# Patient Record
Sex: Female | Born: 1980 | Race: Black or African American | Hispanic: No | Marital: Married | Smoking: Never smoker
Health system: Southern US, Community
[De-identification: ages and names within clinical notes are randomized; demographics above are authoritative.]

## PROBLEM LIST (undated history)

## (undated) ENCOUNTER — Inpatient Hospital Stay (HOSPITAL_COMMUNITY): Payer: Self-pay

## (undated) DIAGNOSIS — N90810 Female genital mutilation status, unspecified: Secondary | ICD-10-CM

## (undated) DIAGNOSIS — R51 Headache: Secondary | ICD-10-CM

## (undated) DIAGNOSIS — Z843 Family history of consanguinity: Secondary | ICD-10-CM

## (undated) DIAGNOSIS — R768 Other specified abnormal immunological findings in serum: Secondary | ICD-10-CM

## (undated) DIAGNOSIS — O24419 Gestational diabetes mellitus in pregnancy, unspecified control: Secondary | ICD-10-CM

## (undated) HISTORY — DX: Female genital mutilation status, unspecified: N90.810

## (undated) HISTORY — DX: Headache: R51

## (undated) HISTORY — DX: Gestational diabetes mellitus in pregnancy, unspecified control: O24.419

## (undated) HISTORY — PX: CHOLECYSTECTOMY: SHX55

## (undated) HISTORY — DX: Other specified abnormal immunological findings in serum: R76.8

## (undated) HISTORY — PX: TONSILLECTOMY: SUR1361

## (undated) HISTORY — PX: OTHER SURGICAL HISTORY: SHX169

## (undated) HISTORY — DX: Family history of consanguinity: Z84.3

---

## 2004-07-13 ENCOUNTER — Emergency Department (HOSPITAL_COMMUNITY): Admission: EM | Admit: 2004-07-13 | Discharge: 2004-07-13 | Payer: Self-pay | Admitting: Emergency Medicine

## 2005-01-18 ENCOUNTER — Inpatient Hospital Stay (HOSPITAL_COMMUNITY): Admission: AD | Admit: 2005-01-18 | Discharge: 2005-01-18 | Payer: Self-pay | Admitting: Gynecology

## 2005-01-24 ENCOUNTER — Other Ambulatory Visit: Admission: RE | Admit: 2005-01-24 | Discharge: 2005-01-24 | Payer: Self-pay | Admitting: Obstetrics and Gynecology

## 2005-04-01 ENCOUNTER — Ambulatory Visit: Payer: Self-pay | Admitting: Gastroenterology

## 2005-05-21 ENCOUNTER — Ambulatory Visit (HOSPITAL_COMMUNITY): Admission: RE | Admit: 2005-05-21 | Discharge: 2005-05-21 | Payer: Self-pay | Admitting: Gastroenterology

## 2005-05-29 ENCOUNTER — Ambulatory Visit: Payer: Self-pay | Admitting: Gastroenterology

## 2005-06-19 ENCOUNTER — Encounter: Admission: RE | Admit: 2005-06-19 | Discharge: 2005-06-19 | Payer: Self-pay | Admitting: Obstetrics and Gynecology

## 2005-08-05 ENCOUNTER — Inpatient Hospital Stay (HOSPITAL_COMMUNITY): Admission: AD | Admit: 2005-08-05 | Discharge: 2005-08-08 | Payer: Self-pay | Admitting: Obstetrics and Gynecology

## 2005-09-14 ENCOUNTER — Emergency Department (HOSPITAL_COMMUNITY): Admission: EM | Admit: 2005-09-14 | Discharge: 2005-09-14 | Payer: Self-pay | Admitting: Emergency Medicine

## 2005-09-15 ENCOUNTER — Emergency Department (HOSPITAL_COMMUNITY): Admission: EM | Admit: 2005-09-15 | Discharge: 2005-09-15 | Payer: Self-pay | Admitting: *Deleted

## 2005-11-14 ENCOUNTER — Ambulatory Visit (HOSPITAL_COMMUNITY): Admission: RE | Admit: 2005-11-14 | Discharge: 2005-11-14 | Payer: Self-pay | Admitting: Obstetrics and Gynecology

## 2005-11-14 ENCOUNTER — Encounter (INDEPENDENT_AMBULATORY_CARE_PROVIDER_SITE_OTHER): Payer: Self-pay | Admitting: Specialist

## 2005-11-15 ENCOUNTER — Inpatient Hospital Stay (HOSPITAL_COMMUNITY): Admission: AD | Admit: 2005-11-15 | Discharge: 2005-11-16 | Payer: Self-pay | Admitting: Obstetrics and Gynecology

## 2005-11-21 ENCOUNTER — Ambulatory Visit: Payer: Self-pay | Admitting: Gastroenterology

## 2006-01-05 ENCOUNTER — Ambulatory Visit: Payer: Self-pay | Admitting: Endocrinology

## 2006-04-16 ENCOUNTER — Encounter (INDEPENDENT_AMBULATORY_CARE_PROVIDER_SITE_OTHER): Payer: Self-pay | Admitting: Specialist

## 2006-04-16 ENCOUNTER — Ambulatory Visit (HOSPITAL_BASED_OUTPATIENT_CLINIC_OR_DEPARTMENT_OTHER): Admission: RE | Admit: 2006-04-16 | Discharge: 2006-04-16 | Payer: Self-pay | Admitting: Otolaryngology

## 2006-07-10 ENCOUNTER — Ambulatory Visit: Payer: Self-pay | Admitting: Endocrinology

## 2006-07-21 ENCOUNTER — Ambulatory Visit: Payer: Self-pay | Admitting: Gastroenterology

## 2006-08-19 ENCOUNTER — Ambulatory Visit (HOSPITAL_COMMUNITY): Admission: RE | Admit: 2006-08-19 | Discharge: 2006-08-19 | Payer: Self-pay | Admitting: Gastroenterology

## 2007-07-14 ENCOUNTER — Encounter (INDEPENDENT_AMBULATORY_CARE_PROVIDER_SITE_OTHER): Payer: Self-pay | Admitting: Obstetrics and Gynecology

## 2007-07-14 ENCOUNTER — Inpatient Hospital Stay (HOSPITAL_COMMUNITY): Admission: RE | Admit: 2007-07-14 | Discharge: 2007-07-16 | Payer: Self-pay | Admitting: Obstetrics and Gynecology

## 2007-09-07 ENCOUNTER — Ambulatory Visit: Payer: Self-pay | Admitting: Endocrinology

## 2007-09-07 DIAGNOSIS — R51 Headache: Secondary | ICD-10-CM

## 2007-09-07 DIAGNOSIS — R519 Headache, unspecified: Secondary | ICD-10-CM | POA: Insufficient documentation

## 2007-09-07 DIAGNOSIS — R109 Unspecified abdominal pain: Secondary | ICD-10-CM | POA: Insufficient documentation

## 2007-09-08 LAB — CONVERTED CEMR LAB
AST: 36 units/L (ref 0–37)
Albumin: 3.8 g/dL (ref 3.5–5.2)
Amylase: 59 units/L (ref 27–131)
BUN: 9 mg/dL (ref 6–23)
Bilirubin, Direct: 0.1 mg/dL (ref 0.0–0.3)
CO2: 31 meq/L (ref 19–32)
Calcium: 9 mg/dL (ref 8.4–10.5)
Chloride: 106 meq/L (ref 96–112)
Eosinophils Absolute: 0.3 10*3/uL (ref 0.0–0.7)
GFR calc Af Amer: 157 mL/min
Glucose, Bld: 96 mg/dL (ref 70–99)
HCT: 38.6 % (ref 36.0–46.0)
Lymphocytes Relative: 38.2 % (ref 12.0–46.0)
MCHC: 35.6 g/dL (ref 30.0–36.0)
MCV: 88.9 fL (ref 78.0–100.0)
Monocytes Absolute: 0.7 10*3/uL (ref 0.1–1.0)
Neutrophils Relative %: 46.5 % (ref 43.0–77.0)
RDW: 12.1 % (ref 11.5–14.6)
Sed Rate: 32 mm/hr — ABNORMAL HIGH (ref 0–22)
Total Protein: 7.6 g/dL (ref 6.0–8.3)

## 2007-09-17 ENCOUNTER — Encounter: Admission: RE | Admit: 2007-09-17 | Discharge: 2007-09-17 | Payer: Self-pay | Admitting: Endocrinology

## 2007-10-06 ENCOUNTER — Telehealth: Payer: Self-pay | Admitting: Endocrinology

## 2007-10-08 ENCOUNTER — Encounter: Payer: Self-pay | Admitting: Endocrinology

## 2007-12-15 ENCOUNTER — Ambulatory Visit: Payer: Self-pay | Admitting: Endocrinology

## 2007-12-15 DIAGNOSIS — K8020 Calculus of gallbladder without cholecystitis without obstruction: Secondary | ICD-10-CM | POA: Insufficient documentation

## 2007-12-15 DIAGNOSIS — E079 Disorder of thyroid, unspecified: Secondary | ICD-10-CM | POA: Insufficient documentation

## 2007-12-16 LAB — CONVERTED CEMR LAB
AST: 22 units/L (ref 0–37)
Amylase: 71 units/L (ref 27–131)
BUN: 14 mg/dL (ref 6–23)
Basophils Relative: 0.8 % (ref 0.0–3.0)
Chloride: 108 meq/L (ref 96–112)
Creatinine, Ser: 0.7 mg/dL (ref 0.4–1.2)
Eosinophils Relative: 4.1 % (ref 0.0–5.0)
GFR calc Af Amer: 130 mL/min
GFR calc non Af Amer: 108 mL/min
HCT: 38.2 % (ref 36.0–46.0)
Hemoglobin: 13.6 g/dL (ref 12.0–15.0)
Monocytes Absolute: 0.6 10*3/uL (ref 0.1–1.0)
Monocytes Relative: 8 % (ref 3.0–12.0)
Platelets: 232 10*3/uL (ref 150–400)
RDW: 12.1 % (ref 11.5–14.6)
TSH: 3.2 microintl units/mL (ref 0.35–5.50)
Total Bilirubin: 0.5 mg/dL (ref 0.3–1.2)
Total Protein: 7.8 g/dL (ref 6.0–8.3)

## 2009-04-26 ENCOUNTER — Ambulatory Visit: Payer: Self-pay | Admitting: Endocrinology

## 2009-04-27 LAB — CONVERTED CEMR LAB
ALT: 19 units/L (ref 0–35)
Alkaline Phosphatase: 68 units/L (ref 39–117)
Amylase: 69 units/L (ref 27–131)
BUN: 12 mg/dL (ref 6–23)
Basophils Absolute: 0 10*3/uL (ref 0.0–0.1)
Basophils Relative: 0.5 % (ref 0.0–3.0)
Bilirubin Urine: NEGATIVE
Calcium: 9.2 mg/dL (ref 8.4–10.5)
Cholesterol: 136 mg/dL (ref 0–200)
Creatinine, Ser: 0.6 mg/dL (ref 0.4–1.2)
Eosinophils Absolute: 0.2 10*3/uL (ref 0.0–0.7)
Eosinophils Relative: 3.8 % (ref 0.0–5.0)
GFR calc non Af Amer: 153.64 mL/min (ref >60)
Glucose, Bld: 95 mg/dL (ref 70–99)
HCT: 38.3 % (ref 36.0–46.0)
HDL: 31.9 mg/dL — ABNORMAL LOW (ref >39.00)
Hemoglobin, Urine: NEGATIVE
LDL Cholesterol: 72 mg/dL (ref 0–99)
MCV: 91.5 fL (ref 78.0–100.0)
Monocytes Absolute: 0.6 10*3/uL (ref 0.1–1.0)
Neutro Abs: 3.1 10*3/uL (ref 1.4–7.7)
Neutrophils Relative %: 46.7 % (ref 43.0–77.0)
RBC: 4.19 M/uL (ref 3.87–5.11)
RDW: 12 % (ref 11.5–14.6)
Specific Gravity, Urine: 1.025 (ref 1.000–1.030)
TSH: 1.79 microintl units/mL (ref 0.35–5.50)
Triglycerides: 163 mg/dL — ABNORMAL HIGH (ref 0.0–149.0)
Urobilinogen, UA: 0.2 (ref 0.0–1.0)
VLDL: 32.6 mg/dL (ref 0.0–40.0)
WBC: 6.4 10*3/uL (ref 4.5–10.5)
pH: 5.5 (ref 5.0–8.0)

## 2009-05-03 ENCOUNTER — Encounter: Admission: RE | Admit: 2009-05-03 | Discharge: 2009-05-03 | Payer: Self-pay | Admitting: Endocrinology

## 2009-05-10 ENCOUNTER — Ambulatory Visit: Payer: Self-pay | Admitting: Endocrinology

## 2009-05-22 ENCOUNTER — Encounter: Payer: Self-pay | Admitting: Endocrinology

## 2009-05-29 ENCOUNTER — Other Ambulatory Visit: Payer: Self-pay | Admitting: General Surgery

## 2009-06-01 ENCOUNTER — Observation Stay (HOSPITAL_COMMUNITY): Admission: EM | Admit: 2009-06-01 | Discharge: 2009-06-02 | Payer: Self-pay | Admitting: General Surgery

## 2009-06-03 ENCOUNTER — Telehealth: Payer: Self-pay

## 2009-06-15 ENCOUNTER — Encounter: Payer: Self-pay | Admitting: Endocrinology

## 2010-03-11 ENCOUNTER — Encounter: Payer: Self-pay | Admitting: Endocrinology

## 2010-03-11 LAB — CONVERTED CEMR LAB: TSH: 1.703 microintl units/mL (ref 0.350–4.500)

## 2010-03-24 NOTE — L&D Delivery Note (Signed)
Delivery Note   Kyleigh, Nannini [[id number]]  At 10:36 PM a viable female was delivered vertex via Vaginal, Spontaneous Delivery.  APGAR: pending ; weight pending .      Zandra, Lajeunesse [[id number]]  Ultrasound confirmed baby B breech.  By palpation, both feet at cervix.  Both feet were grasped vaginally and membranes ruptured.  With gentle traction the baby easily descended.  At 10:41 PM a viable female was delivered via Vaginal, Breech extraction.  APGAR: pending; weight pending .   Placentas delivered spontaneous and intact, sent to pathology with clamp on A cord.  Anesthesia:  Epidural Episiotomy: None Lacerations: None Suture Repair: none Est. Blood Loss (mL): 400  At patient request a skin tag on right inner thigh removed sharply.  Bleeding controlled with pressure and bandaid.   Mom to postpartum.   Baby A to nursery-stable.   Baby B to nursery-stable.  Gerilyn Stargell D 02/20/2011, 11:01 PM

## 2010-04-14 ENCOUNTER — Encounter: Payer: Self-pay | Admitting: Gastroenterology

## 2010-04-14 ENCOUNTER — Encounter: Payer: Self-pay | Admitting: Endocrinology

## 2010-04-23 NOTE — Assessment & Plan Note (Signed)
Summary: f/u appt per husband/#/cd   Vital Signs:  Patient profile:   30 year old female Height:      68 inches (172.72 cm) Weight:      188.38 pounds (85.63 kg) O2 Sat:      99 % on Room air Temp:     98.6 degrees F (37.00 degrees C) oral Pulse rate:   76 / minute BP sitting:   112 / 64  (left arm) Cuff size:   large  Vitals Entered By: Josph Macho RMA (May 10, 2009 8:23 AM)  O2 Flow:  Room air CC: Follow-up visit/ CF   CC:  Follow-up visit/ CF.  History of Present Illness: pt says she still has intermittent pain at the periumbilical area.  no associated n/v.  lmp was 04/20/09.  Current Medications (verified): 1)  Flexeril 10 Mg Tabs (Cyclobenzaprine Hcl) .... Three Times A Day As Needed Headache  Allergies (verified): No Known Drug Allergies  Past History:  Past Medical History: Last updated: 12/15/2007 UNSPECIFIED DISORDER OF THYROID (ICD-246.9) CHOLELITHIASIS (ICD-574.20) HEADACHE (ICD-784.0) ABDOMINAL PAIN, UNSPECIFIED SITE (ICD-789.00)  Review of Systems  The patient denies fever.    Physical Exam  General:  normal appearance.   Abdomen:  abdomen is soft, nontender.  no hepatosplenomegaly.   not distended.  no hernia    Impression & Recommendations:  Problem # 1:  ABDOMINAL PAIN, UNSPECIFIED SITE (ICD-789.00) ? due to cholelithiasis  Medications Added to Medication List This Visit: 1)  Tramadol-acetaminophen 37.5-325 Mg Tabs (Tramadol-acetaminophen) .Marland Kitchen.. 1 every 4 hrs as needed pain  Other Orders: Admin 1st Vaccine (04540) Flu Vaccine 74yrs + (98119) Est. Patient Level III (14782)  Patient Instructions: 1)  keep appointment with surgery dr next week. 2)  you should avoid pregnancy until advised it is safe. 3)  ultracet 1 every 4 hrs as needed pain. Prescriptions: TRAMADOL-ACETAMINOPHEN 37.5-325 MG TABS (TRAMADOL-ACETAMINOPHEN) 1 every 4 hrs as needed pain  #50 x 1   Entered and Authorized by:   Minus Breeding MD   Signed by:   Minus Breeding MD on 05/10/2009   Method used:   Electronically to        [id number] (retail)       [address]       [place], [place]  [address]       Ph: [phone] or [phone]       Fax: [phone]   RxID:   [id number]  Flu Vaccine Consent Questions     Do you have a history of severe allergic reactions to this vaccine? no    Any prior history of allergic reactions to egg and/or gelatin? no    Do you have a sensitivity to the preservative Thimersol? no    Do you have a past history of Guillan-Barre Syndrome? no    Do you currently have an acute febrile illness? no    Have you ever had a severe reaction to latex? no    Vaccine information given and explained to patient? yes    Are you currently pregnant? no    Lot Number:[id number]   Exp Date:09/20/2009   Site Given  Left Deltoid IM       Ph: [phone] or [phone]       Fax: [phone]   RxID:   [id number]   .lbflu

## 2010-04-23 NOTE — Letter (Signed)
Summary: [facility]   Imported By: Sherian Rein 07/19/2009 08:58:33  _____________________________________________________________________  External Attachment:    Type:   Image     Comment:   External Document

## 2010-04-23 NOTE — Assessment & Plan Note (Signed)
Summary: follow up gallbladder-lb   Vital Signs:  Patient profile:   30 year old female Height:      68 inches (172.72 cm) Weight:      190.13 pounds (86.42 kg) BMI:     29.01 O2 Sat:      99 % on Room air Temp:     96.8 degrees F (36 degrees C) oral Pulse rate:   83 / minute BP sitting:   100 / 64  (left arm) Cuff size:   large  Vitals Entered By: Josph Macho CMA (April 26, 2009 8:21 AM)  O2 Flow:  Room air CC: Follow-up visit on gallbladder/ CF   CC:  Follow-up visit on gallbladder/ CF.  History of Present Illness: pt did not have gb surgery done.  she says sxs initially improved, but she now has few mos of pain at the ruq area, and associated nausea.  nexuim helped when i gave her some samples in 2009. she says she continues to have migraine every other day. she has h/o abnormal tft. lmp was last week.  Current Medications (verified): 1)  None  Allergies (verified): No Known Drug Allergies  Past History:  Past Medical History: Last updated: 12/15/2007 UNSPECIFIED DISORDER OF THYROID (ICD-246.9) CHOLELITHIASIS (ICD-574.20) HEADACHE (ICD-784.0) ABDOMINAL PAIN, UNSPECIFIED SITE (ICD-789.00)  Review of Systems       denies vomiting  Physical Exam  General:  normal appearance.   Abdomen:  abdomen is soft, nontender.  no hepatosplenomegaly.   not distended.  no hernia  Additional Exam:  (labs reviewed)   Impression & Recommendations:  Problem # 1:  CHOLELITHIASIS (ICD-574.20)  Problem # 2:  ABDOMINAL PAIN, UNSPECIFIED SITE (ICD-789.00) uncertain if related to #1  Problem # 3:  HEADACHE (ICD-784.0) recurrent  Problem # 4:  UNSPECIFIED DISORDER OF THYROID (ICD-246.9) resolved  Medications Added to Medication List This Visit: 1)  Flexeril 10 Mg Tabs (Cyclobenzaprine hcl) .... Three times a day as needed headache  Other Orders: Surgical Referral (Surgery) Radiology Referral (Radiology) TLB-Lipid Panel (80061-LIPID) TLB-BMP (Basic Metabolic  Panel-BMET) (80048-METABOL) TLB-CBC Platelet - w/Differential (85025-CBCD) TLB-Hepatic/Liver Function Pnl (80076-HEPATIC) TLB-TSH (Thyroid Stimulating Hormone) (84443-TSH) TLB-Udip w/ Micro (81001-URINE) TLB-Amylase (82150-AMYL) TLB-Sedimentation Rate (ESR) (85652-ESR) Est. Patient Level IV (16109)  Patient Instructions: 1)  refer surgery 2)  blood tests today 3)  ultrasound of the abdomen 4)  tests are being ordered for you today.  a few days after the test(s), please call [phone] to hear your test results. 5)  flexeril 10 mg three times a day as needed headache. 6)  please see headache specialist again soon. Prescriptions: FLEXERIL 10 MG TABS (CYCLOBENZAPRINE HCL) three times a day as needed headache  #50 x 2   Entered and Authorized by:   Minus Breeding MD   Signed by:   Minus Breeding MD on 04/26/2009   Method used:   Electronically to        [facility] (retail)       [address]       [place], [place]  [address]       Ph: [phone] or [phone]       Fax: [phone]   RxID:   [id number]

## 2010-04-23 NOTE — Letter (Signed)
Summary: [facility]   Imported By: Sherian Rein 06/13/2009 10:17:33  _____________________________________________________________________  External Attachment:    Type:   Image     Comment:   External Document

## 2010-06-09 ENCOUNTER — Emergency Department (HOSPITAL_COMMUNITY)
Admission: EM | Admit: 2010-06-09 | Discharge: 2010-06-09 | Disposition: A | Discharge status: Home / Self Care | Payer: Medicaid Other | Attending: Emergency Medicine | Admitting: Emergency Medicine

## 2010-06-09 ENCOUNTER — Emergency Department (HOSPITAL_COMMUNITY): Payer: Medicaid Other

## 2010-06-09 DIAGNOSIS — R079 Chest pain, unspecified: Secondary | ICD-10-CM | POA: Insufficient documentation

## 2010-06-09 DIAGNOSIS — K219 Gastro-esophageal reflux disease without esophagitis: Secondary | ICD-10-CM | POA: Insufficient documentation

## 2010-06-09 LAB — COMPREHENSIVE METABOLIC PANEL
AST: 17 U/L (ref 0–37)
Chloride: 103 mEq/L (ref 96–112)
GFR calc Af Amer: >60 mL/min (ref >60)
GFR calc non Af Amer: >60 mL/min (ref >60)
Potassium: 3.9 mEq/L (ref 3.5–5.1)
Sodium: 136 mEq/L (ref 135–145)
Total Bilirubin: 0.7 mg/dL (ref 0.3–1.2)
Total Protein: 8.1 g/dL (ref 6.0–8.3)

## 2010-06-09 LAB — CBC
HCT: 40.6 % (ref 36.0–46.0)
MCV: 90.6 fL (ref 78.0–100.0)
RBC: 4.48 MIL/uL (ref 3.87–5.11)
WBC: 5.9 10*3/uL (ref 4.0–10.5)

## 2010-06-09 LAB — POCT CARDIAC MARKERS
CKMB, poc: <1 ng/mL — ABNORMAL LOW (ref 1.0–8.0)
Myoglobin, poc: 71.1 ng/mL (ref 12–200)

## 2010-06-16 LAB — CBC
HCT: 38.8 % (ref 36.0–46.0)
Hemoglobin: 13.5 g/dL (ref 12.0–15.0)
MCHC: 34.8 g/dL (ref 30.0–36.0)
MCV: 89.7 fL (ref 78.0–100.0)
RBC: 4.33 MIL/uL (ref 3.87–5.11)
WBC: 5.8 10*3/uL (ref 4.0–10.5)

## 2010-07-23 ENCOUNTER — Emergency Department (HOSPITAL_COMMUNITY): Payer: Medicaid Other

## 2010-07-23 ENCOUNTER — Emergency Department (HOSPITAL_COMMUNITY)
Admission: EM | Admit: 2010-07-23 | Discharge: 2010-07-23 | Disposition: A | Discharge status: Home / Self Care | Payer: Medicaid Other | Attending: Emergency Medicine | Admitting: Emergency Medicine

## 2010-07-23 DIAGNOSIS — R109 Unspecified abdominal pain: Secondary | ICD-10-CM | POA: Insufficient documentation

## 2010-07-23 DIAGNOSIS — O99891 Other specified diseases and conditions complicating pregnancy: Secondary | ICD-10-CM | POA: Insufficient documentation

## 2010-07-23 LAB — WET PREP, GENITAL
Trich, Wet Prep: NONE SEEN
Yeast Wet Prep HPF POC: NONE SEEN

## 2010-07-23 LAB — POCT I-STAT, CHEM 8
BUN: 12 mg/dL (ref 6–23)
Calcium, Ion: 1.15 mmol/L (ref 1.12–1.32)
HCT: 36 % (ref 36.0–46.0)
Hemoglobin: 12.2 g/dL (ref 12.0–15.0)
Potassium: 4.2 mEq/L (ref 3.5–5.1)
TCO2: 22 mmol/L (ref 0–100)

## 2010-07-23 LAB — URINALYSIS, ROUTINE W REFLEX MICROSCOPIC
Nitrite: NEGATIVE
Protein, ur: NEGATIVE mg/dL
Specific Gravity, Urine: 1.037 — ABNORMAL HIGH (ref 1.005–1.030)
Urobilinogen, UA: 0.2 mg/dL (ref 0.0–1.0)

## 2010-07-23 LAB — HCG, QUANTITATIVE, PREGNANCY: hCG, Beta Chain, Quant, S: 86553 m[IU]/mL — ABNORMAL HIGH (ref <5)

## 2010-08-06 NOTE — Discharge Summary (Signed)
Cheryl Harper, Cheryl Harper              ACCOUNT NO.:  [id number]   MEDICAL RECORD NO.:  [id number]          PATIENT TYPE:  INP   LOCATION:  9139                          FACILITY:  [facility]   PHYSICIAN:  Malachi Pro. Ambrose Mantle, M.D. DATE OF BIRTH:  September 21, 1980   DATE OF ADMISSION:  07/14/2007  DATE OF DISCHARGE:  07/16/2007                               DISCHARGE SUMMARY   HISTORY OF PRESENT ILLNESS:  This is a 30 year old black married female  para 1-0-0-1, gravida 2, EDC July 17, 2007, admitted for induction of  labor.  Blood group and type A positive, negative antibody, sickle cell  negative, RPR nonreactive, rubella immune, hepatitis B surface antigen  positive, HIV negative, GC and chlamydia negative, cystic fibrosis  negative, and quad screen negative.  One-hour Glucola 145.  Three-hour  GTT 78, 164, 155, and 140.  Group B strep negative.  Vaginal ultrasound  on January 18, 2007, crown-rump length 7.52 cm, 13 weeks 4 days, [facility]  July 17, 2007.  Ultrasound on March 05, 2007, of normal anatomy.  Liver function tests were normal and prenatal care was uncomplicated.   PAST MEDICAL HISTORY:  No known allergies.   OPERATIONS:  In 2007, she had removal of the left labial cyst and  reconstruction of her labia.   ILLNESSES:  She had positive hepatitis B surface antigen.   SOCIAL HISTORY:  Alcohol, tobacco, and drugs none.   FAMILY HISTORY:  Father with diabetes and died from a myocardial  infarction.  Cousins, mental retardation.  The patient and husband are  related, their fathers are cousins.   OBSTETRICAL HISTORY:  In May 2007, the patient delivered a 7-pound 13-  ounce female vaginally with vacuum assistance.   On admission, her vital signs were normal.  Heart and lungs were normal.  The abdomen was soft.  Fundal height had been 40 cm on July 13, 2007.  Fetal heart tones were normal.  Cervix was 2 cm, 50% vertex at -2 to -3  on July 13, 2007.  After admission, the patient was placed on  Pitocin  at 8:52 a.m.  The Pitocin was at 4 milliunits a minute.  Cervix was 3  cm, 70% vertex at a -2 to -3.  Artificial rupture of the membranes was  attempted, no fluid was seen.  I alerted the nurse to be on the look out  for meconium staining because of the fact, we did not find fluid.  At  approximately 11:30 a.m. fetal heart rate began to show some variable  decelerations .  At 12:30 p.m. the nurse stopped the Pitocin because of  worsening decelerations.  At 12:55 p.m. the cervix was 7-8 cm, 100%  vertex was at -1.  Fetal heart tones appeared normal without the  Pitocin.  Pitocin was started back at 2 milliunits a minute.  Subsequently, it was discontinued again because of variable  decelerations.  At 1:25 p.m. the cervix was 8 cm and 100%.  The patient  became fully dilated and had some decelerations.  She pushed well and  delivered OA with 1 loop of nuchal cord, a 7-pound  5-ounce female infant,  Apgars of 9 at 1 and 9 at  5 minutes.  Dr. Ambrose Mantle was in attendance.  There was thick meconium that followed the head, bulb suction was used,  but the DeLee could not be prepared in time to do a suction of the  baby's nose and mouth.  The suction tubing was not in the room.  Placenta was intact.  Uterus was normal.  Second-degree right of midline  laceration was repaired with 3-0 Vicryl.  Blood loss was about 400 mL.  A small teat of tissue was removed from the 8 o'clock position at the  introitus after showing it to the husband and asking if he felt it  should be removed.  Rectal exam was negative.  Postpartum, the patient  did well and was discharged on the second postpartum day.   LABORATORY DATA:  Initial hemoglobin 12, hematocrit 34.3, and white  count 10,600.  Followup hemoglobin 10.8, hematocrit 30.3, and white  count 11,700.  RPR nonreactive.   FINAL DIAGNOSES:  Intrauterine pregnancy at 39 plus weeks, delivered OA.  Positive hepatitis B surface antigen.  Operation spontaneous  delivery  OA, repair of second-degree right of midline laceration, and removal of  the teat of tissue from 8 o'clock at the vaginal introitus.   FINAL CONDITION:  Improved.   INSTRUCTIONS:  Include our regular discharge instruction booklet.  Motrin 600 mg 1 q.6 h. as needed for pain is given at discharge.  The  patient is also to continue her prenatal vitamins and return to the  office in 6 weeks for followup examination.      Malachi Pro. Ambrose Mantle, M.D.  Electronically Signed     TFH/MEDQ  D:  07/16/2007  T:  07/16/2007  Job:  [id number]

## 2010-08-09 NOTE — Op Note (Signed)
Cheryl Harper, Cheryl Harper              ACCOUNT NO.:  [id number]   MEDICAL RECORD NO.:  [id number]          PATIENT TYPE:  AMB   LOCATION:  DSC                          FACILITY:  [facility]   PHYSICIAN:  Newman Pies, MD            DATE OF BIRTH:  1980/03/30   DATE OF PROCEDURE:  04/16/2006  DATE OF DISCHARGE:                               OPERATIVE REPORT   SURGEON:  Newman Pies, MD   PREOPERATIVE DIAGNOSES:  1. Chronic pharyngitis/tonsillitis.  2. Adenotonsillar hypertrophy.   POSTOPERATIVE DIAGNOSES:  1. Chronic pharyngitis/tonsillitis.  2. Adenotonsillar hypertrophy.   PROCEDURE PERFORMED:  Adenotonsillectomy.   ANESTHESIA:  General endotracheal tube anesthesia.   COMPLICATIONS:  None.   ESTIMATED BLOOD LOSS:  Minimal.   INDICATIONS FOR PROCEDURE:  Ms. Cheryl Harper is a 30 year old African  American female who has a history of chronic pharyngitis and  tonsillitis.  On examination, she was noted to have significant  tonsillar hypertrophy.  Based on the findings, the decision was made for  the patient to undergo adenotonsillectomy.  The risks, benefits,  alternatives, and details of the procedure were discussed with the  patient and her husband.  They wished to proceed with the above-stated  procedure.  All questions were answered and informed consent was  obtained.   DESCRIPTION OF PROCEDURE:  The patient was taken to the operating room  and placed supine on the operating table.  General endotracheal tube  anesthesia was administered by the anesthesiologist.  Preop IV  antibiotics and Decadron were given.  She was then prepped and draped in  the standard fashion for adenotonsillectomy.  A Crowe-Davis mouth gag  was inserted into the oral cavity for exposure.  Palpation and  inspection of the palate revealed no submucous cleft.  A red rubber  catheter was used to gently retract the soft palate.  Indirect mirror  examination of the nasopharynx revealed some moderate adenoid  hyperplasia, obstructing approximately half of the nasopharynx.  The  adenoid was resected using the electric cut adenotome.  Hemostasis was  achieved using the suction electrocautery.   Attention was then turned towards the tonsils.  The right tonsil was  grasped with a straight Allis clamp and retracted medially.  It was  resected free from the underlying pharyngeal constrictor muscles using  the Coblator device.  Hemostasis was achieved using the Coblator device  as well.  The same procedure was then repeated on the left side without  exception.  Both tonsils and the previously resected adenoid was sent to  the pathology department for permanent histologic identification.  The  surgical site was copiously irrigated.  The Crowe-Davis mouth gag was  removed.  Final inspection of the lips, gums, tongue, and surrounding  structures revealed no evidence of injury.  The care of the patient was  turned over to the anesthesiologist.  The patient was awakened from  anesthesia without difficulty.  She was extubated and transferred to  recovery room in good condition.   SPECIMENS REMOVED:  Adenoids and tonsils.   OPERATIVE FINDINGS:  Two-plus tonsils bilaterally.  Moderate  adenoid  hyperplasia.   FOLLOWUP CARE:  The patient will be observed in the postanesthetic care  unit.  Once she is awake, alert and tolerating p.o., she will be  discharged home.  She will be given pain medication and antibiotics for  her postop course.  She will follow up in my office in 2 weeks.      Newman Pies, MD  Electronically Signed     ST/MEDQ  D:  04/16/2006  T:  04/16/2006  Job:  [id number]

## 2010-08-09 NOTE — Op Note (Signed)
Cheryl Harper, Cheryl Harper              ACCOUNT NO.:  [id number]   MEDICAL RECORD NO.:  [id number]          PATIENT TYPE:  AMB   LOCATION:  SDC                           [facility]   PHYSICIAN:  Malachi Pro. Ambrose Mantle, M.D. DATE OF BIRTH:  08-04-1980   DATE OF PROCEDURE:  11/14/2005  DATE OF DISCHARGE:                                 OPERATIVE REPORT   PREOPERATIVE DIAGNOSIS:  Right upper labia minora cyst that is quite tender  that has not responded antibiotics, prior female circumcision.   POSTOPERATIVE DIAGNOSIS:  Right upper labia minora cyst that is quite tender  that has not responded antibiotics, prior female circumcision.   OPERATION:  Removal of the right upper labial cyst, reconstruction of both  labia minora.   OPERATOR:  Malachi Pro. Ambrose Mantle, M.D.   ANESTHESIA:  Spinal anesthesia.   The patient was given a spinal anesthetic by Dr. Cristela Blue.  She was  placed in lithotomy position.  Exam revealed approximately a 3 x 2 cm firm  mass occupying the upper right labia minus. The patient had a prior female  circumcision. It was well away from the urethral meatus.  I prepped the area  with Betadine solution and draped as a sterile field.  I drew the mass up  into the operative field and with a #15 blade, removed the mass. In removing  the mass, it was apparent that even though the mass was on the right side  that the right and left labia minora needed reconstruction and it was  apparent that even though the patient had had a female circumcision, that  she still had clitoral tissue present that was now exposed. So I  reconstructed with 3-0 Vicryl continuous sutures the right and left labia  minora and left the clitoris exposed so that she will have somewhat more  normal anatomy.  I obtained hemostasis with cautery and pressure and the  procedure was terminated.  Blood loss was thought to be less than 10 mL.  The patient was returned to recovery in satisfactory condition.      Malachi Pro. Ambrose Mantle, M.D.  Electronically Signed     TFH/MEDQ  D:  11/14/2005  T:  11/14/2005  Job:  [id number]

## 2010-08-09 NOTE — Consult Note (Signed)
[facility]                          ENDOCRINOLOGY CONSULTATION   Cheryl Harper, Cheryl Harper                     MRN:          [id number]  DATE:07/10/2006                            DOB:          [birth year]    REASON FOR VISIT:  Followup thyroid.   HISTORY OF PRESENT ILLNESS:  A 30 year old woman who has a history of  mildly abnormal thyroid function studies. She states that she is feeling  well recently except for slight hair loss.   FAMILY HISTORY:  Her sister had an uncertain type of thyroid condition.   REVIEW OF SYSTEMS:  She has gained several pounds since her last visit  last year.   PHYSICAL EXAMINATION:  VITAL SIGNS:  Blood pressure 104/67, heart rate  88, temperature 98.0. Weight 186.  GENERAL:  No distress.  NECK:  Thyroid normal to my examination.  SKIN:  Not diaphoretic.  NEUROLOGIC:  No tremor.   LABORATORY DATA:  On July 10, 2006 free T4 normal at 0.9 and TSH normal  at 1.22.   IMPRESSION:  1. History of mildly abnormal thyroid function studies of uncertain      etiology.  2. Slight hair loss of uncertain etiology.   PLAN:  Return in a year, or sooner if you feel like you have some  symptoms, such that you want to have your thyroid checked sooner.     Sean A. Everardo All, MD  Electronically Signed    SAE/MedQ  DD: 07/12/2006  DT: 07/12/2006  Job #: [id number]   cc:   Malachi Pro. Ambrose Mantle, M.D.

## 2010-08-09 NOTE — Consult Note (Signed)
[facility]                            ENDOCRINOLOGY ANNTIONETTE, MADKINS                       MRN:          [id number]  DATE:01/05/2006                            DOB:          1980-05-11    REFERRING PHYSICIAN:  Malachi Pro. Ambrose Mantle, M.D.   REASON FOR REFERRAL:  Abnormal thyroid function studies.   HISTORY OF PRESENT ILLNESS:  A 30 year old woman who had some thyroid  function studies done as surveillance, given her history of hepatitis  (unknown type). This was abnormal on two occasions. She is now five months  post-partum. She gained 15 pounds during her pregnancy but has since lost 8  pounds. She otherwise feels well, except for slight diffuse headache for a  few weeks and some associated fatigue.   PAST MEDICAL HISTORY:  Otherwise healthy.  She takes no medications.   SOCIAL HISTORY:  She is originally from [place]. She is here with her husband,  and the patient does not work outside the home.   REVIEW OF SYSTEMS:  Denies the following:  Fever, palpitations, syncope,  shortness of breath, excessive diaphoresis, tremor, anxiety, and depression.   PHYSICAL EXAMINATION:  VITAL SIGNS: Blood pressure 107/70, heart rate 81,  temperature 97.6, weight 178.  GENERAL: No distress.  SKIN: Normal texture and temperature. There is no rash.  HEENT: No proptosis, no periorbital swelling. Pharynx, no erythema.  NECK: The thyroid is not enlarged on my examination.  CHEST: Clear to auscultation, no respiratory distress.  CARDIOVASCULAR: No JVD, no edema. Regular rate and rhythm, no murmur. Pedal  pulses are intact.  NEUROLOGIC: Alert and oriented, does not appear anxious nor depressed and  sensation is intact to touch and in the feet, and there is no tremor.   LABORATORY DATA:  Forwarded by Dr. Ambrose Mantle. She had a TSH of 0.018 in  September 2007. On December 31, 2005, TSH was 5.16. Free thyroxine index 1.9,  which is mid-normal.   IMPRESSION:  1. There are several possible scenarios as to why she has had this      variation in her thyroid function studies. This is generally the time      of year when viral (subacute) thyroiditis is most common. It is      possible also that she is developing auto-immune thyroiditis in the      postpartum state.  2. Symptom complex of headache and fatigue, which I think is very unlikely      to be thyroid related, given her recent normal TSH.   PLAN:  I spoke with the patient, and I told her that the only way to tell  where this is going is to follow her thyroid function studies for now, and  she and her husband say that they would like to return in two weeks and I  agreed with this plan.            ______________________________  Cleophas Dunker. Everardo All, MD     SAE/MedQ  DD:  01/06/2006  DT:  01/07/2006  Job #:  [id number]   cc:   Malachi Pro. Ambrose Mantle, M.D.

## 2010-08-09 NOTE — Discharge Summary (Signed)
Cheryl Harper, Cheryl Harper              ACCOUNT NO.:  [id number]   MEDICAL RECORD NO.:  [id number]          PATIENT TYPE:  INP   LOCATION:  9134                          FACILITY:  [facility]   PHYSICIAN:  Zenaida Niece, M.D.DATE OF BIRTH:  Jan 19, 1981   DATE OF ADMISSION:  08/05/2005  DATE OF DISCHARGE:  08/08/2005                                 DISCHARGE SUMMARY   ADMISSION DIAGNOSIS:  1.  Intrauterine pregnancy at 39 weeks.  2.  Group B strep carrier.  3.  Gestational diabetes.  4.  Hepatitis B surface antigen positive.   DISCHARGE DIAGNOSES:  1.  Intrauterine pregnancy at 39 weeks.  2.  Group B strep carrier.  3.  Gestational diabetes.  4.  Hepatitis B surface antigen positive.   PROCEDURES:  On Aug 06, 2005, she had a vacuum-assisted vaginal delivery   HISTORY AND PHYSICAL:  This is a 30 year old black female, gravida 1, para 0  with an EGA of 39+ weeks who presents with complaint of rupture of membranes  and contractions without bleeding and with good fetal movement.  Evaluation  in maternity admissions confirmed rupture of membranes with moderate  meconium and cervix was 2-cm dilated.   PRENATAL CARE:  Complicated by positive hepatitis B surface antigen with  normal LFTs.  She was referred to the hepatitis clinic.  She also had  gestational diabetes well controlled with diet.   PRENATAL LABORATORY:  Blood type is A+ with a negative antibody screen.  RPR  nonreactive.  Rubella immune.  Hepatitis B surface antigen is positive.  HIV  is negative.  Gonorrhea and Chlamydia negative.  Triple screen is normal.  One-hour Glucola 166, 3-hour GTT 88, 199, 179, and 133.  And, Group B strep  is positive.   PAST HISTORY:  Is otherwise noncontributory.   PHYSICAL EXAMINATION:  VITAL SIGNS:  She is afebrile with stable vital  signs.  Fetal heart tracing is reactive with contractions every 2-3 minutes.  ABDOMEN:  Gravid, nontender with an estimated fetal weight of 7.5 pounds.  PELVIC:   Cervix, on my first exam, is 3 cm dilated with vertex presentation,  adequate pelvis, and an IUPC was placed.   HOSPITAL COURSE:  The patient was admitted and placed on penicillin for  Group B strep prophylaxis.  Her contractions slightly spaced out and she was  started on Pitocin.  She progressed in active labor.  She progressed to  complete and pushed well.  She brought the vertex to a +3 station but could  not push it past the pubic bone.  She thus had a vacuum-assisted vaginal  delivery of a viable female infant with Apgars of 8 and 9 that weighed 7  pounds 13 ounces.  DeLee and bulb suction was performed on the perineum  without evidence of meconium and the NICU team with Dr. Eric Form was in  attendance.  Placenta delivered spontaneous, was intact.  She had a second-  degree laceration with extension to the right vaginal sulcus, repaired with  2-0 and 3-0 Vicryl.  She also had a vertical tear anteriorly at the site of  her prior female circumcision and this was repaired horizontally to open up  space over the urethra.  Estimated blood loss was 500 cc.  Postpartum, she  had no significant complications.  Then, on postpartum number two was stable  for discharge home.   DISCHARGE INSTRUCTIONS:  1.  Regular diet.  2.  Pelvic rest.  3.  Follow up in six weeks.   MEDICATIONS:  1.  Percocet, #30, one to two p.o. q.4-6h. p.r.n. pain.  2.  Over-the-counter ibuprofen as needed.   She is given our discharge pamphlet.      Zenaida Niece, M.D.  Electronically Signed     TDM/MEDQ  D:  08/08/2005  T:  08/08/2005  Job:  [id number]

## 2010-09-30 ENCOUNTER — Other Ambulatory Visit: Payer: Self-pay

## 2010-09-30 ENCOUNTER — Other Ambulatory Visit (HOSPITAL_COMMUNITY): Payer: Self-pay | Admitting: Obstetrics and Gynecology

## 2010-09-30 DIAGNOSIS — Z3689 Encounter for other specified antenatal screening: Secondary | ICD-10-CM

## 2010-09-30 LAB — HIV ANTIBODY (ROUTINE TESTING W REFLEX): HIV: NONREACTIVE

## 2010-09-30 LAB — HEPATITIS B SURFACE ANTIGEN: Hepatitis B Surface Ag: POSITIVE

## 2010-10-04 ENCOUNTER — Ambulatory Visit (HOSPITAL_COMMUNITY): Payer: Medicaid Other

## 2010-12-17 LAB — CBC
HCT: 30.3 — ABNORMAL LOW
HCT: 34.3 — ABNORMAL LOW
Hemoglobin: 12
MCHC: 35.1
MCHC: 35.8
MCV: 90.8
MCV: 90.9
Platelets: 193
RBC: 3.78 — ABNORMAL LOW
RDW: 14.4

## 2010-12-31 ENCOUNTER — Ambulatory Visit (HOSPITAL_COMMUNITY): Admission: RE | Admit: 2010-12-31 | Payer: Medicaid Other | Source: Ambulatory Visit

## 2011-01-23 ENCOUNTER — Ambulatory Visit (HOSPITAL_COMMUNITY)
Admission: RE | Admit: 2011-01-23 | Discharge: 2011-01-23 | Disposition: A | Discharge status: Home / Self Care | Payer: Medicaid Other | Source: Ambulatory Visit | Attending: Obstetrics and Gynecology | Admitting: Obstetrics and Gynecology

## 2011-01-23 ENCOUNTER — Encounter (HOSPITAL_COMMUNITY): Payer: Self-pay

## 2011-01-23 DIAGNOSIS — Z3689 Encounter for other specified antenatal screening: Secondary | ICD-10-CM | POA: Insufficient documentation

## 2011-01-23 DIAGNOSIS — O30049 Twin pregnancy, dichorionic/diamniotic, unspecified trimester: Secondary | ICD-10-CM | POA: Insufficient documentation

## 2011-01-23 NOTE — Progress Notes (Signed)
Report in [facility]; follow-up as needed 

## 2011-02-04 ENCOUNTER — Other Ambulatory Visit (HOSPITAL_COMMUNITY): Payer: Self-pay | Admitting: Obstetrics and Gynecology

## 2011-02-04 ENCOUNTER — Ambulatory Visit (HOSPITAL_COMMUNITY)
Admission: RE | Admit: 2011-02-04 | Discharge: 2011-02-04 | Disposition: A | Discharge status: Home / Self Care | Payer: Medicaid Other | Source: Ambulatory Visit | Attending: Obstetrics and Gynecology | Admitting: Obstetrics and Gynecology

## 2011-02-04 DIAGNOSIS — O30009 Twin pregnancy, unspecified number of placenta and unspecified number of amniotic sacs, unspecified trimester: Secondary | ICD-10-CM | POA: Insufficient documentation

## 2011-02-04 DIAGNOSIS — IMO0001 Reserved for inherently not codable concepts without codable children: Secondary | ICD-10-CM

## 2011-02-04 DIAGNOSIS — O368390 Maternal care for abnormalities of the fetal heart rate or rhythm, unspecified trimester, not applicable or unspecified: Secondary | ICD-10-CM | POA: Insufficient documentation

## 2011-02-07 ENCOUNTER — Ambulatory Visit (HOSPITAL_COMMUNITY)
Admission: RE | Admit: 2011-02-07 | Discharge: 2011-02-07 | Disposition: A | Discharge status: Home / Self Care | Payer: Medicaid Other | Source: Ambulatory Visit | Attending: Obstetrics and Gynecology | Admitting: Obstetrics and Gynecology

## 2011-02-07 ENCOUNTER — Other Ambulatory Visit (HOSPITAL_COMMUNITY): Payer: Self-pay | Admitting: Obstetrics and Gynecology

## 2011-02-07 DIAGNOSIS — O30009 Twin pregnancy, unspecified number of placenta and unspecified number of amniotic sacs, unspecified trimester: Secondary | ICD-10-CM | POA: Insufficient documentation

## 2011-02-07 DIAGNOSIS — O288 Other abnormal findings on antenatal screening of mother: Secondary | ICD-10-CM

## 2011-02-07 DIAGNOSIS — O368390 Maternal care for abnormalities of the fetal heart rate or rhythm, unspecified trimester, not applicable or unspecified: Secondary | ICD-10-CM | POA: Insufficient documentation

## 2011-02-12 ENCOUNTER — Other Ambulatory Visit (HOSPITAL_COMMUNITY): Payer: Self-pay | Admitting: *Deleted

## 2011-02-15 ENCOUNTER — Other Ambulatory Visit (HOSPITAL_COMMUNITY): Payer: Medicaid Other

## 2011-02-18 ENCOUNTER — Encounter (HOSPITAL_COMMUNITY): Payer: Self-pay | Admitting: *Deleted

## 2011-02-18 ENCOUNTER — Telehealth (HOSPITAL_COMMUNITY): Payer: Self-pay | Admitting: *Deleted

## 2011-02-18 NOTE — Telephone Encounter (Signed)
Preadmission screen  

## 2011-02-19 ENCOUNTER — Encounter (HOSPITAL_COMMUNITY): Payer: Self-pay | Admitting: *Deleted

## 2011-02-19 NOTE — Telephone Encounter (Signed)
Interpreter [phone] and [id number]

## 2011-02-20 ENCOUNTER — Other Ambulatory Visit: Payer: Self-pay | Admitting: Obstetrics and Gynecology

## 2011-02-20 ENCOUNTER — Encounter (HOSPITAL_COMMUNITY): Payer: Self-pay

## 2011-02-20 ENCOUNTER — Inpatient Hospital Stay (HOSPITAL_COMMUNITY): Payer: Medicaid Other | Admitting: Anesthesiology

## 2011-02-20 ENCOUNTER — Inpatient Hospital Stay (HOSPITAL_COMMUNITY)
Admission: RE | Admit: 2011-02-20 | Discharge: 2011-02-22 | DRG: 775 | Disposition: A | Discharge status: Home / Self Care | Payer: Medicaid Other | Source: Ambulatory Visit | Attending: Obstetrics and Gynecology | Admitting: Obstetrics and Gynecology

## 2011-02-20 ENCOUNTER — Encounter (HOSPITAL_COMMUNITY): Payer: Self-pay | Admitting: Anesthesiology

## 2011-02-20 DIAGNOSIS — L909 Atrophic disorder of skin, unspecified: Secondary | ICD-10-CM | POA: Diagnosis present

## 2011-02-20 DIAGNOSIS — O3090 Multiple gestation, unspecified, unspecified trimester: Principal | ICD-10-CM | POA: Diagnosis present

## 2011-02-20 DIAGNOSIS — O30049 Twin pregnancy, dichorionic/diamniotic, unspecified trimester: Secondary | ICD-10-CM

## 2011-02-20 DIAGNOSIS — O30009 Twin pregnancy, unspecified number of placenta and unspecified number of amniotic sacs, unspecified trimester: Secondary | ICD-10-CM | POA: Diagnosis present

## 2011-02-20 DIAGNOSIS — L919 Hypertrophic disorder of the skin, unspecified: Secondary | ICD-10-CM | POA: Diagnosis present

## 2011-02-20 DIAGNOSIS — N90810 Female genital mutilation status, unspecified: Secondary | ICD-10-CM | POA: Diagnosis present

## 2011-02-20 DIAGNOSIS — O99892 Other specified diseases and conditions complicating childbirth: Secondary | ICD-10-CM | POA: Diagnosis present

## 2011-02-20 LAB — CBC
HCT: 34 % — ABNORMAL LOW (ref 36.0–46.0)
Hemoglobin: 11.4 g/dL — ABNORMAL LOW (ref 12.0–15.0)
MCV: 92.9 fL (ref 78.0–100.0)
WBC: 7.3 10*3/uL (ref 4.0–10.5)

## 2011-02-20 MED ORDER — TERBUTALINE SULFATE 1 MG/ML IJ SOLN: 0.2500 mg | Freq: Once | INTRAMUSCULAR | Status: DC | PRN

## 2011-02-20 MED ORDER — OXYTOCIN BOLUS FROM INFUSION
500.0000 mL | Freq: Once | INTRAVENOUS | Status: DC
  Filled 2011-02-20: qty 500

## 2011-02-20 MED ORDER — LACTATED RINGERS IV SOLN: 500.0000 mL | Freq: Once | INTRAVENOUS | Status: DC

## 2011-02-20 MED ORDER — OXYTOCIN 10 UNIT/ML IJ SOLN
INTRAMUSCULAR | Status: AC
  Filled 2011-02-20: qty 2

## 2011-02-20 MED ORDER — FENTANYL 2.5 MCG/ML BUPIVACAINE 1/10 % EPIDURAL INFUSION (WH - ANES)
14.0000 mL/h | INTRAMUSCULAR | Status: DC
Start: 2011-02-20 — End: 2011-02-20
  Administered 2011-02-20: 14 mL/h via EPIDURAL
  Filled 2011-02-20: qty 60

## 2011-02-20 MED ORDER — LIDOCAINE HCL 1.5 % IJ SOLN
INTRAMUSCULAR | Status: DC | PRN
  Administered 2011-02-20 (×2): 5 mL via EPIDURAL

## 2011-02-20 MED ORDER — LACTATED RINGERS IV SOLN
500.0000 mL | INTRAVENOUS | Status: DC | PRN
  Administered 2011-02-20: 500 mL via INTRAVENOUS

## 2011-02-20 MED ORDER — EPHEDRINE 5 MG/ML INJ: 10.0000 mg | INTRAVENOUS | Status: DC | PRN

## 2011-02-20 MED ORDER — ACETAMINOPHEN 325 MG PO TABS: 650.0000 mg | ORAL_TABLET | ORAL | Status: DC | PRN

## 2011-02-20 MED ORDER — CITRIC ACID-SODIUM CITRATE 334-500 MG/5ML PO SOLN
30.0000 mL | ORAL | Status: DC | PRN
  Filled 2011-02-20: qty 15

## 2011-02-20 MED ORDER — DIPHENHYDRAMINE HCL 50 MG/ML IJ SOLN: 12.5000 mg | INTRAMUSCULAR | Status: DC | PRN

## 2011-02-20 MED ORDER — OXYTOCIN 20 UNITS IN LACTATED RINGERS INFUSION - SIMPLE
1.0000 m[IU]/min | INTRAVENOUS | Status: DC
  Filled 2011-02-20 (×2): qty 1000

## 2011-02-20 MED ORDER — OXYTOCIN 20 UNITS IN LACTATED RINGERS INFUSION - SIMPLE
125.0000 mL/h | Freq: Once | INTRAVENOUS | Status: DC
  Filled 2011-02-20: qty 1000

## 2011-02-20 MED ORDER — PHENYLEPHRINE 40 MCG/ML (10ML) SYRINGE FOR IV PUSH (FOR BLOOD PRESSURE SUPPORT): 80.0000 ug | PREFILLED_SYRINGE | INTRAVENOUS | Status: DC | PRN

## 2011-02-20 MED ORDER — LACTATED RINGERS IV SOLN
INTRAVENOUS | Status: DC
  Administered 2011-02-20 (×2): via INTRAVENOUS

## 2011-02-20 MED ORDER — ONDANSETRON HCL 4 MG/2ML IJ SOLN
4.0000 mg | Freq: Four times a day (QID) | INTRAMUSCULAR | Status: DC | PRN
  Administered 2011-02-20: 4 mg via INTRAVENOUS
  Filled 2011-02-20: qty 2

## 2011-02-20 MED ORDER — EPHEDRINE 5 MG/ML INJ
10.0000 mg | INTRAVENOUS | Status: DC | PRN
  Filled 2011-02-20: qty 4

## 2011-02-20 MED ORDER — PHENYLEPHRINE 40 MCG/ML (10ML) SYRINGE FOR IV PUSH (FOR BLOOD PRESSURE SUPPORT)
80.0000 ug | PREFILLED_SYRINGE | INTRAVENOUS | Status: DC | PRN
  Filled 2011-02-20: qty 5

## 2011-02-20 MED ORDER — LIDOCAINE HCL (PF) 1 % IJ SOLN
30.0000 mL | INTRAMUSCULAR | Status: DC | PRN
  Filled 2011-02-20: qty 30

## 2011-02-20 MED ORDER — OXYCODONE-ACETAMINOPHEN 5-325 MG PO TABS: 1.0000 | ORAL_TABLET | ORAL | Status: DC | PRN

## 2011-02-20 MED ORDER — IBUPROFEN 600 MG PO TABS: 600.0000 mg | ORAL_TABLET | Freq: Four times a day (QID) | ORAL | Status: DC | PRN

## 2011-02-20 NOTE — Anesthesia Preprocedure Evaluation (Addendum)
Anesthesia Evaluation  Patient identified by MRN, date of birth, ID band Patient awake    Reviewed: Allergy & Precautions, H&P , Patient's Chart, lab work & pertinent test results  Airway Mallampati: II TM Distance: >3 FB Neck ROM: full    Dental No notable dental hx.    Pulmonary neg pulmonary ROS,  clear to auscultation  Pulmonary exam normal       Cardiovascular neg cardio ROS regular Normal    Neuro/Psych  Headaches, Negative Neurological ROS  Negative Psych ROS   GI/Hepatic negative GI ROS, Neg liver ROS, (+) B  Endo/Other  Negative Endocrine ROSDiabetes mellitus-  Renal/GU negative Renal ROS     Musculoskeletal   Abdominal   Peds  Hematology negative hematology ROS (+)   Anesthesia Other Findings twins  Reproductive/Obstetrics (+) Pregnancy                          Anesthesia Physical Anesthesia Plan  ASA: III  Anesthesia Plan: Epidural   Post-op Pain Management:    Induction:   Airway Management Planned:   Additional Equipment:   Intra-op Plan:   Post-operative Plan:   Informed Consent: I have reviewed the patients History and Physical, chart, labs and discussed the procedure including the risks, benefits and alternatives for the proposed anesthesia with the patient or authorized representative who has indicated his/her understanding and acceptance.     Plan Discussed with:   Anesthesia Plan Comments:         Anesthesia Quick Evaluation

## 2011-02-20 NOTE — Progress Notes (Signed)
Comfortable with epidural VE-6/70/-1, vtx, AROM clear FHT- Cat I x 2, ctx q 4-7 minutes Will monitor progress carefully.

## 2011-02-20 NOTE — Anesthesia Procedure Notes (Signed)
Epidural Patient location during procedure: OB Start time: 02/20/2011 7:13 PM  Staffing Performed by: anesthesiologist   Preanesthetic Checklist Completed: patient identified, site marked, surgical consent, pre-op evaluation, timeout performed, IV checked, risks and benefits discussed and monitors and equipment checked  Epidural Patient position: sitting Prep: site prepped and draped and DuraPrep Patient monitoring: continuous pulse ox and blood pressure Approach: midline Injection technique: LOR air and LOR saline  Needle:  Needle type: Tuohy  Needle gauge: 17 G Needle length: 9 cm Needle insertion depth: 6 cm Catheter type: closed end flexible Catheter size: 19 Gauge Catheter at skin depth: 11 cm Test dose: negative  Assessment Events: blood not aspirated, injection not painful, no injection resistance, negative IV test and no paresthesia  Additional Notes Patient identified.  Risk benefits discussed including failed block, incomplete pain control, headache, nerve damage, paralysis, blood pressure changes, nausea, vomiting, reactions to medication both toxic or allergic, and postpartum back pain.  Patient expressed understanding and wished to proceed.  All questions were answered.  Sterile technique used throughout procedure and epidural site dressed with sterile barrier dressing. No paresthesia or other complications noted.The patient did not experience any signs of intravascular injection such as tinnitus or metallic taste in mouth nor signs of intrathecal spread such as rapid motor block. Please see nursing notes for vital signs.

## 2011-02-20 NOTE — Progress Notes (Signed)
Comfortable Afeb, VSS FHT- Cat I x 2, ctx q 3-4 min VE- 8/90/0, vtx Will recheck in 1 hour, then probably to the OR for delivery

## 2011-02-20 NOTE — H&P (Signed)
Cheryl Harper is a 30 y.o. female, G3 P42, EGA 37+ weeks with di/di twins with vtx/breech presentation presenting for induction.  Twins with concordant growth, she wants SVD, 2 prior SVD.  Prenatal care otherwise uncomplicated, see prenatal records for complete history.  Maternal Medical History:  Fetal activity: Perceived fetal activity is normal.      OB History    Grav Para Term Preterm Abortions TAB SAB Ect Mult Living   3 2 2  0 0 0 0 0 0 2     Past Medical History  Diagnosis Date  . Female circumcision   . Hepatitis B antibody positive   . Headache     migraines  . Family history of consanguinity     pt and husband are 3rd cousins  . Gestational diabetes     2007 pregnancy   Past Surgical History  Procedure Date  . Cholecystectomy   . Labial reconstruction     and repair of cyst  . Tonsillectomy    Family History: family history includes Cancer in her mother; Diabetes in her father; and Heart attack in her father, paternal aunt, and paternal uncle.  There is no history of Anesthesia problems, and Hypotension, and Malignant hyperthermia, and Pseudochol deficiency, . Social History:  reports that she has never smoked. She has never used smokeless tobacco. She reports that she does not drink alcohol or use illicit drugs.  Review of Systems  Respiratory: Negative.   Cardiovascular: Negative.     Dilation: 7 Effacement (%): 70;80 Station: -1 Exam by:: Clearence Cheek RN Blood pressure 112/65, pulse 96, temperature 97.8 F (36.6 C), temperature source Oral, resp. rate 20, height 5\' 5"  (1.651 m), weight 83.462 kg (184 lb), last menstrual period 05/28/2010. Maternal Exam:  Uterine Assessment: Contraction strength is moderate.  Contraction frequency is irregular.   Abdomen: Patient reports no abdominal tenderness. Pelvis: adequate for delivery.      Fetal Exam Fetal Monitor Review: Mode: ultrasound.   Baseline rate: 130 and 140.  Variability: moderate (6-25 bpm).     Pattern: accelerations present and no decelerations.    Fetal State Assessment: Category I - tracings are normal.     Physical Exam  Constitutional: She appears well-developed and well-nourished.  Neck: Neck supple. No thyromegaly present.  Cardiovascular: Normal rate, regular rhythm and normal heart sounds.   No murmur heard. Respiratory: Breath sounds normal. No respiratory distress. She has no wheezes.  GI: Soft.       gravid    Prenatal labs: ABO, Rh: A/Positive/-- (07/09 0000) Antibody: Negative (07/09 0000) Rubella: Immune (07/09 0000) RPR: Nonreactive (07/09 0000)  HBsAg: Positive (07/09 0000)  HIV: Non-reactive (07/09 0000)  GBS: Negative (11/16 0000)   Assessment/Plan: Twin IUP, di/di, vtx/breech, at 37 weeks for induction.  2 prior SVD.  Plan was to start pitocin, but per RN she is 7 cm on her own.  Will get epidural, then AROM and prepare for delivery in OR.   Katrinka Herbison D 02/20/2011, 6:49 PM

## 2011-02-20 NOTE — Progress Notes (Signed)
VE- C/C/+1 To OR for delivery

## 2011-02-21 ENCOUNTER — Encounter (HOSPITAL_COMMUNITY): Payer: Self-pay

## 2011-02-21 MED ORDER — BENZOCAINE-MENTHOL 20-0.5 % EX AERO: 1.0000 "application " | INHALATION_SPRAY | CUTANEOUS | Status: DC | PRN

## 2011-02-21 MED ORDER — PRENATAL PLUS 27-1 MG PO TABS
1.0000 | ORAL_TABLET | Freq: Every day | ORAL | Status: DC
  Administered 2011-02-21 – 2011-02-22 (×2): 1 via ORAL
  Filled 2011-02-21 (×2): qty 1

## 2011-02-21 MED ORDER — METHYLERGONOVINE MALEATE 0.2 MG/ML IJ SOLN
0.2000 mg | INTRAMUSCULAR | Status: DC | PRN
Start: 2011-02-21 — End: 2011-02-22

## 2011-02-21 MED ORDER — OXYTOCIN 20 UNITS IN LACTATED RINGERS INFUSION - SIMPLE: 125.0000 mL/h | INTRAVENOUS | Status: DC | PRN

## 2011-02-21 MED ORDER — MAGNESIUM HYDROXIDE 400 MG/5ML PO SUSP: 30.0000 mL | ORAL | Status: DC | PRN

## 2011-02-21 MED ORDER — ONDANSETRON HCL 4 MG/2ML IJ SOLN: 4.0000 mg | INTRAMUSCULAR | Status: DC | PRN

## 2011-02-21 MED ORDER — ONDANSETRON HCL 4 MG PO TABS: 4.0000 mg | ORAL_TABLET | ORAL | Status: DC | PRN

## 2011-02-21 MED ORDER — DIBUCAINE 1 % RE OINT: 1.0000 "application " | TOPICAL_OINTMENT | RECTAL | Status: DC | PRN

## 2011-02-21 MED ORDER — OXYCODONE-ACETAMINOPHEN 5-325 MG PO TABS
1.0000 | ORAL_TABLET | ORAL | Status: DC | PRN
Start: 2011-02-21 — End: 2011-02-22
  Administered 2011-02-21: 1 via ORAL
  Filled 2011-02-21: qty 1

## 2011-02-21 MED ORDER — MEASLES, MUMPS & RUBELLA VAC ~~LOC~~ INJ
0.5000 mL | INJECTION | Freq: Once | SUBCUTANEOUS | Status: DC
  Filled 2011-02-21: qty 0.5

## 2011-02-21 MED ORDER — METHYLERGONOVINE MALEATE 0.2 MG PO TABS: 0.2000 mg | ORAL_TABLET | ORAL | Status: DC | PRN

## 2011-02-21 MED ORDER — IBUPROFEN 600 MG PO TABS
600.0000 mg | ORAL_TABLET | Freq: Four times a day (QID) | ORAL | Status: DC
  Administered 2011-02-21 – 2011-02-22 (×7): 600 mg via ORAL
  Filled 2011-02-21 (×7): qty 1

## 2011-02-21 MED ORDER — SENNOSIDES-DOCUSATE SODIUM 8.6-50 MG PO TABS
2.0000 | ORAL_TABLET | Freq: Every day | ORAL | Status: DC
  Administered 2011-02-21: 2 via ORAL

## 2011-02-21 MED ORDER — TETANUS-DIPHTH-ACELL PERTUSSIS 5-2.5-18.5 LF-MCG/0.5 IM SUSP
0.5000 mL | Freq: Once | INTRAMUSCULAR | Status: AC
  Administered 2011-02-21: 0.5 mL via INTRAMUSCULAR
  Filled 2011-02-21: qty 0.5

## 2011-02-21 MED ORDER — WITCH HAZEL-GLYCERIN EX PADS: 1.0000 "application " | MEDICATED_PAD | CUTANEOUS | Status: DC | PRN

## 2011-02-21 MED ORDER — LANOLIN HYDROUS EX OINT: TOPICAL_OINTMENT | CUTANEOUS | Status: DC | PRN

## 2011-02-21 MED ORDER — SIMETHICONE 80 MG PO CHEW: 80.0000 mg | CHEWABLE_TABLET | ORAL | Status: DC | PRN

## 2011-02-21 MED ORDER — ZOLPIDEM TARTRATE 5 MG PO TABS: 5.0000 mg | ORAL_TABLET | Freq: Every evening | ORAL | Status: DC | PRN

## 2011-02-21 MED ORDER — DIPHENHYDRAMINE HCL 25 MG PO CAPS: 25.0000 mg | ORAL_CAPSULE | Freq: Four times a day (QID) | ORAL | Status: DC | PRN

## 2011-02-21 NOTE — Progress Notes (Signed)
PPD#1 Doing well Afeb, VSS Fundus firm, NT at U-0 Continue routine care

## 2011-02-21 NOTE — Anesthesia Postprocedure Evaluation (Signed)
Anesthesia Post Note  Patient: Cheryl Harper  Procedure(s) Performed: * No procedures listed *  Anesthesia type: Epidural  Patient location: Mother/Baby  Post pain: Pain level controlled  Post assessment: Post-op Vital signs reviewed  Last Vitals:  Filed Vitals:   02/21/11 0530  BP: 96/61  Pulse: 80  Temp: 36.6 C  Resp: 18    Post vital signs: Reviewed  Level of consciousness:alert  Complications: No apparent anesthesia complications

## 2011-02-21 NOTE — Progress Notes (Signed)
UR Chart review completed.  

## 2011-02-22 MED ORDER — IBUPROFEN 600 MG PO TABS: 600.0000 mg | ORAL_TABLET | Freq: Four times a day (QID) | ORAL | Status: AC

## 2011-02-22 NOTE — Progress Notes (Signed)
Post Partum Day 2 Subjective: voiding and tolerating PO, some hemorrhoids  Objective: Blood pressure 107/74, pulse 77, temperature 97.7 F (36.5 C), temperature source Oral, resp. rate 18, height 5\' 5"  (1.651 m), weight 83.462 kg (184 lb), last menstrual period 05/28/2010, SpO2 100.00%, unknown if currently breastfeeding.  Physical Exam:  General: alert Lochia: appropriate Uterine Fundus: firm  Basename 02/20/11 1530  HGB 11.4*  HCT 34.0*    Assessment/Plan: Discharge home Motrin F/u office 6 weeks Plans IUD pp   LOS: 2 days   Cheryl Harper 02/22/2011, 7:39 AM

## 2011-02-22 NOTE — Discharge Summary (Signed)
Obstetric Discharge Summary Reason for Admission: induction of labor Prenatal Procedures: ultrasound Intrapartum Procedures: spontaneous vaginal delivery x 2 Postpartum Procedures: none Complications-Operative and Postpartum: none Hemoglobin  Date Value Range Status  02/20/2011 11.4* 12.0-15.0 (g/dL) Final     HCT  Date Value Range Status  02/20/2011 34.0* 36.0-46.0 (%) Final    Discharge Diagnoses: Term Pregnancy-delivered at 37+ weeks                   Twin gestation with NSVD x 2                   Vertex/ breech  Discharge Information: Date: 02/22/2011 Activity: pelvic rest Diet: routine Medications: Ibuprofen Condition: stable Instructions: refer to practice specific booklet Discharge to: home Follow-up Information    Follow up with MEISINGER,TODD D, MD. Make an appointment in 6 weeks.   Contact information:   [address], [address] [place] [address] [phone]          Newborn Data:   Mattilynn, Forrer [[id number]]  Live born female  Birth Weight: 6 lb 1.7 oz (2770 g) APGAR: 9, 9   Trent, Theisen [[id number]]  Live born female  Birth Weight: 4 lb 14.8 oz (2235 g) APGAR: 9, 9  Home with mother.  Oliver Pila 02/22/2011, 7:40 AM

## 2011-02-22 NOTE — Progress Notes (Signed)
Bedside RN requested LCSW consult due to reported past history of postpartum depression.  Sister briefly discussed history of depression following previous pregnancy.  MOB also upset over the fact that her mother was unable to obtain a Visa to come the [place] to support and assist mom.  MOB had guests arriving, so we opted to come back tomorrow to discuss further and assess need for ongoing support.  Updated bedside RN.  RN reports twins will be patients so MOB expected to stay longer.   Diaz Crago, LCSW, 02/22/2011, 3:19 pm

## 2012-06-08 ENCOUNTER — Ambulatory Visit: Payer: Medicaid Other | Admitting: Endocrinology

## 2012-09-14 IMAGING — US US OB COMP LESS 14 WK
1 series · 14 of 28 positions shown · non-contrast
Comparison: None.

CLINICAL DATA: Abdominal pain.  Pregnancy.

TWIN OBSTETRIC <14WK US AND TRANSVAGINAL OB US
Transabdominal scanning was performed as well.  Transvaginal
scanning was performed due to nonvisualization of the ovaries.

[Series 1: us ob comp less 14 wk · 0.30mm/px · 14 of 61 slices shown]
[im 3/61]
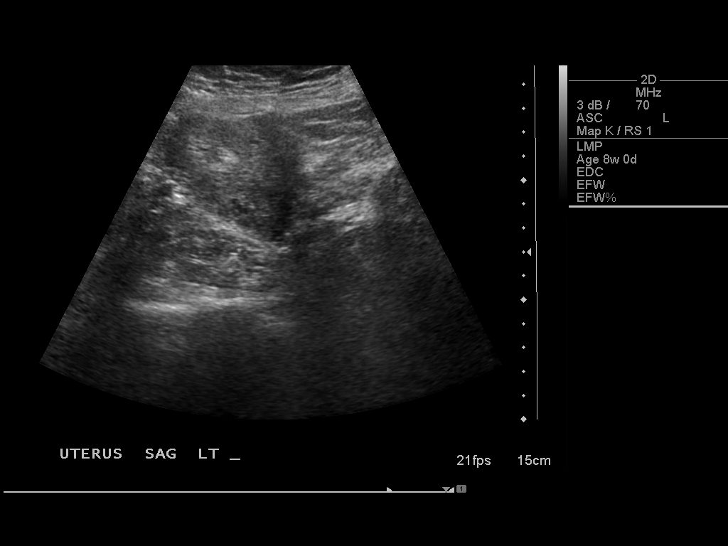
[im 7/61]
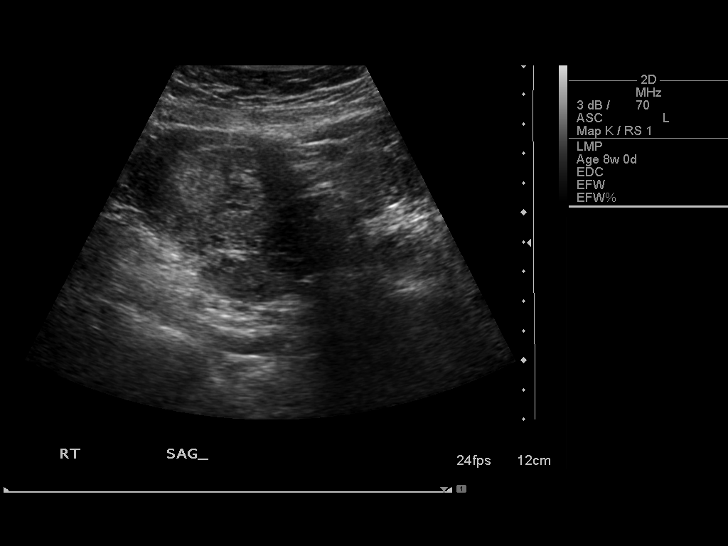
[im 12/61]
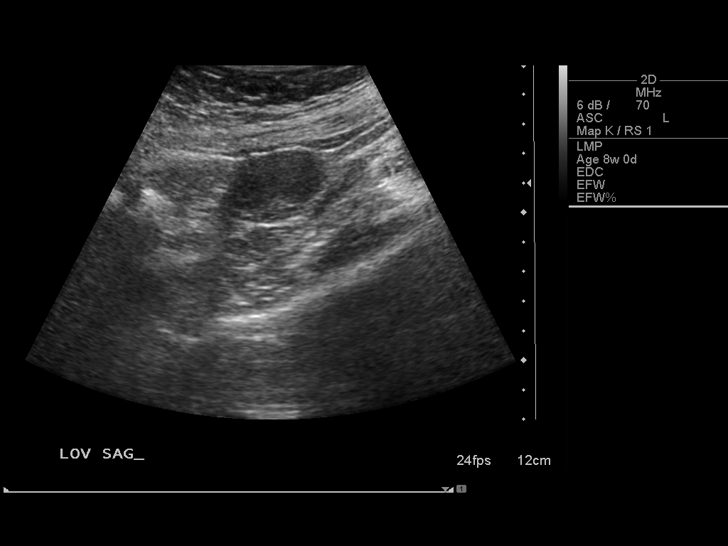
[im 16/61]
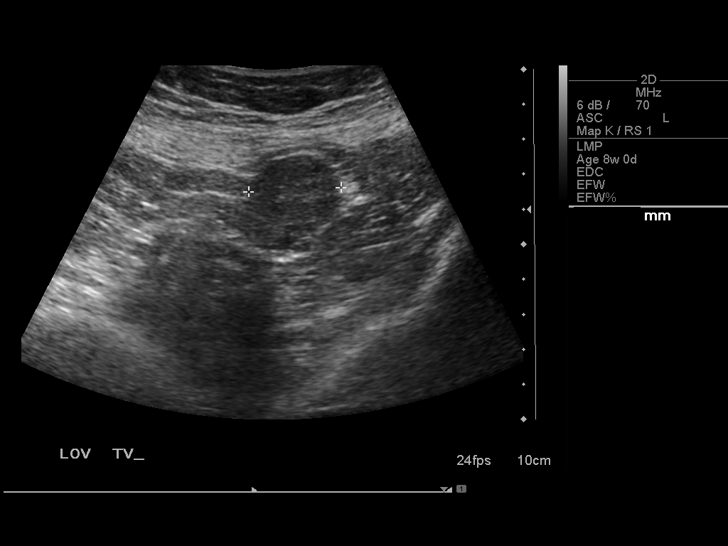
[im 21/61]
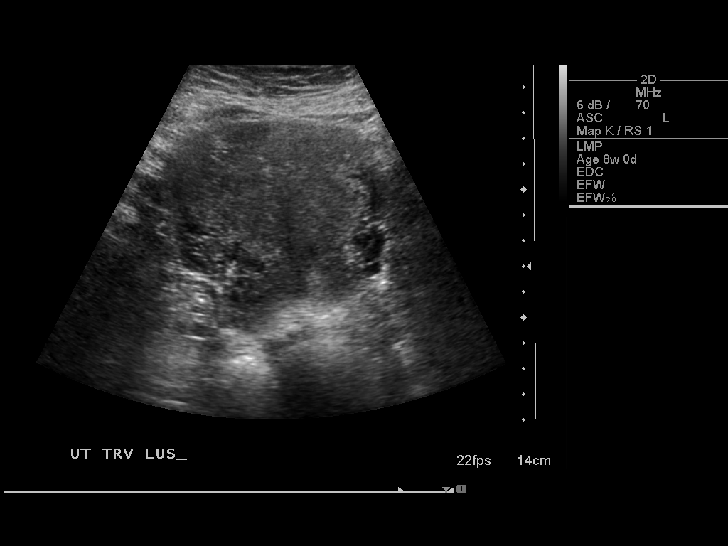
[im 25/61]
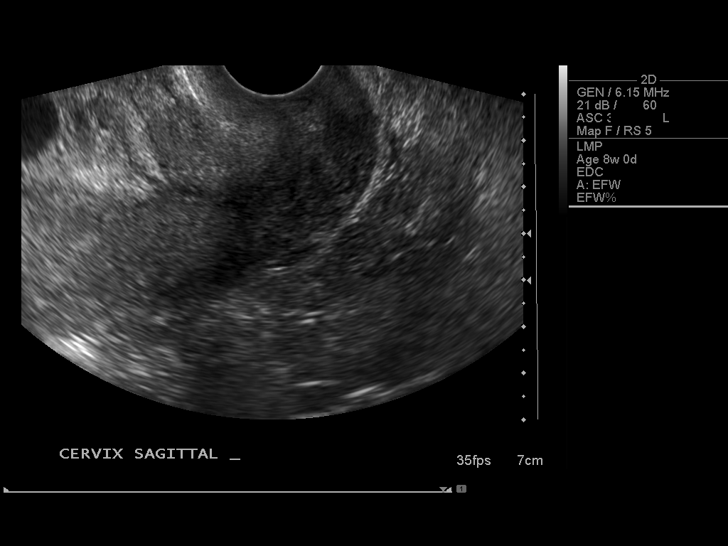
[im 29/61]
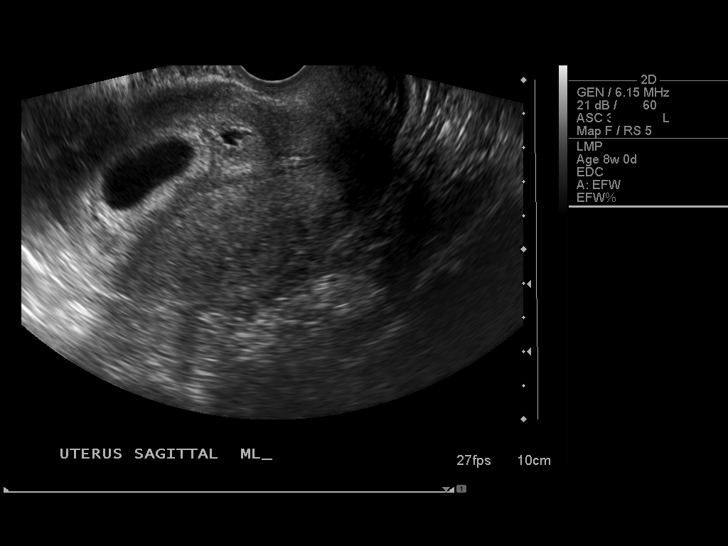
[im 34/61]
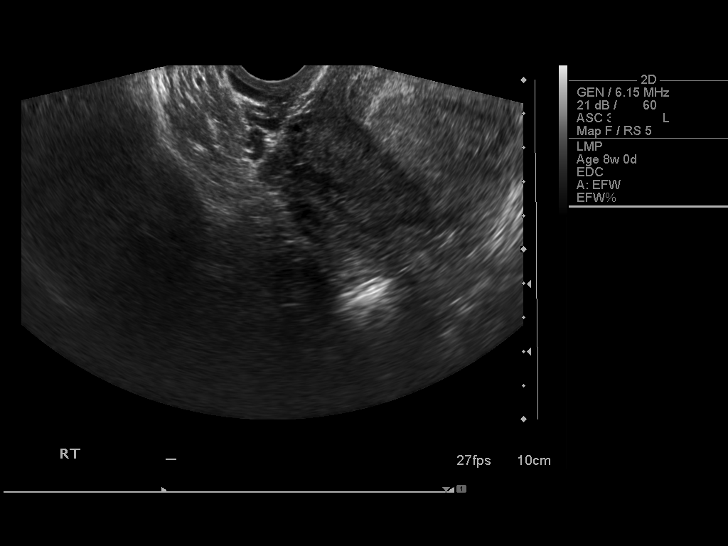
[im 38/61]
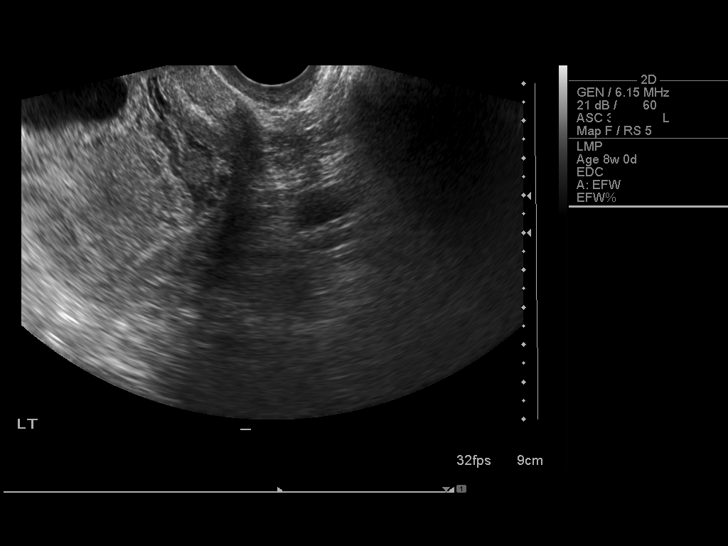
[im 43/61]
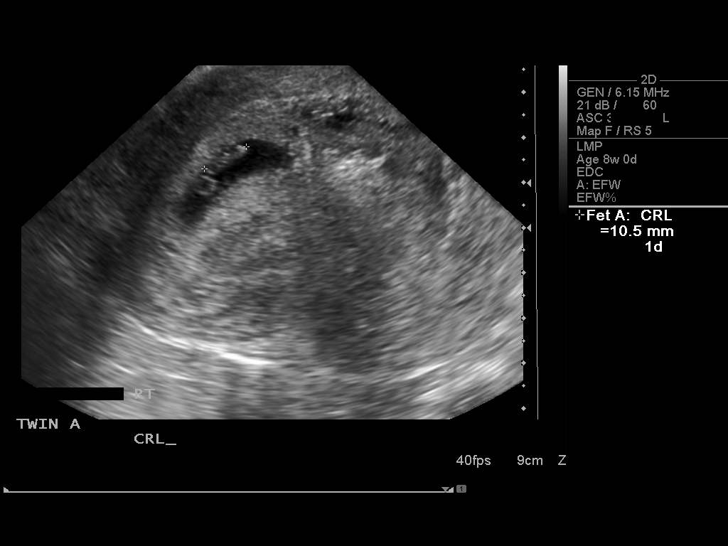
[im 47/61]
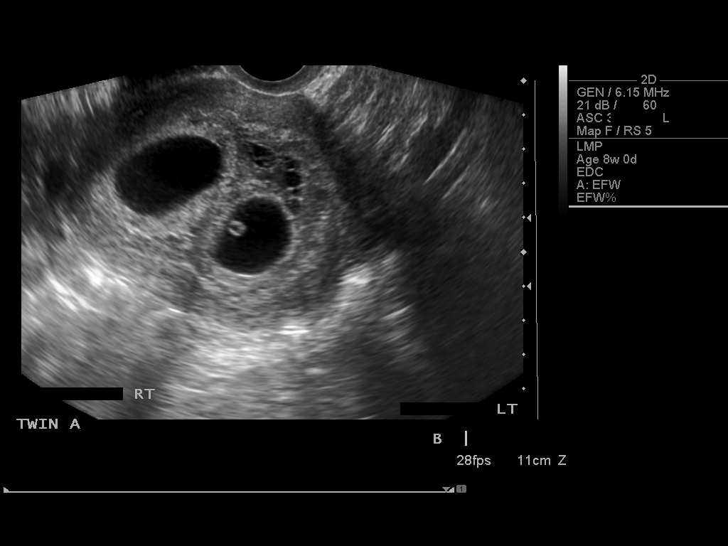
[im 52/61]
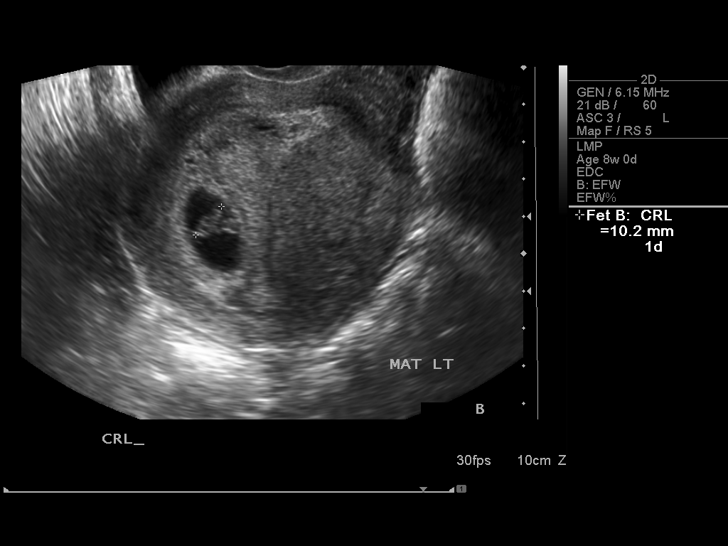
[im 56/61]
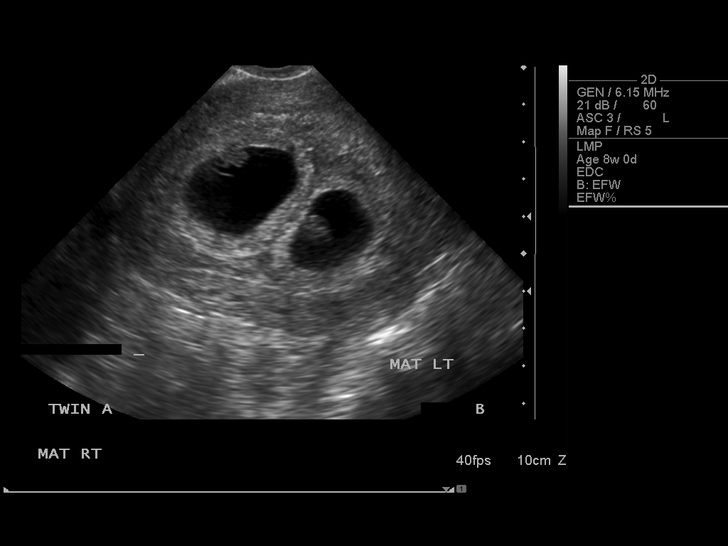
[im 61/61]
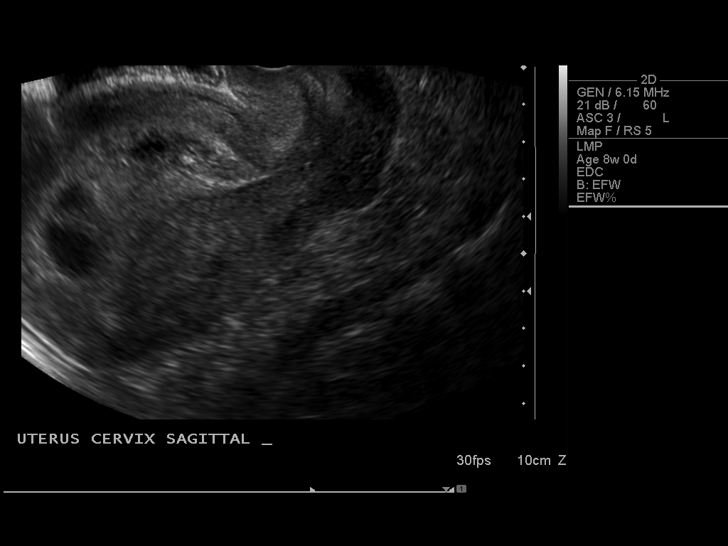

[14 of 28 positions shown; findings below may reference images not displayed]

TWIN A
Heart rate: 150 bpm
Presentation: Not applicable
Amniotic Fluid (Subjective): Not applicable

MSD:  w     d
CRL: 10 mm  sevenw      4d          US EDC: 03/11/2011

Complete fetal anatomic evaluation could not be performed due to
early GA.

TWIN B
Heart rate:  206bpm
Presentation: Not applicable
Amniotic Fluid (Subjective): Not applicable

MSD:  w     d
CRL:  9.7 mm      7w    4d          US EDC: 03/11/2011

Complete fetal anatomic evaluation could not be performed due to
early GA.

Maternal uterus/adnexae:
Physiologic appearance of the ovaries. Question tiny subchorionic
hemorrhage at the anterior margin of twin A gestational sac.  No
free fluid.
IMPRESSION: Viable twin intrauterine pregnancies.  Estimated gestational age 7
weeks 4 days. Follow-up fetal 20-week anatomic ultrasound
recommended, or sooner if clinically indicated.

## 2013-03-29 IMAGING — US US FETAL BPP W/O NONSTRESS
1 series · 13 of 28 positions shown · non-contrast
Comparison: none

[Series 1: us fetal bpp w/o nonstress · non-contrast · 29 acquisitions, 13 frames shown]
[im 2/29]
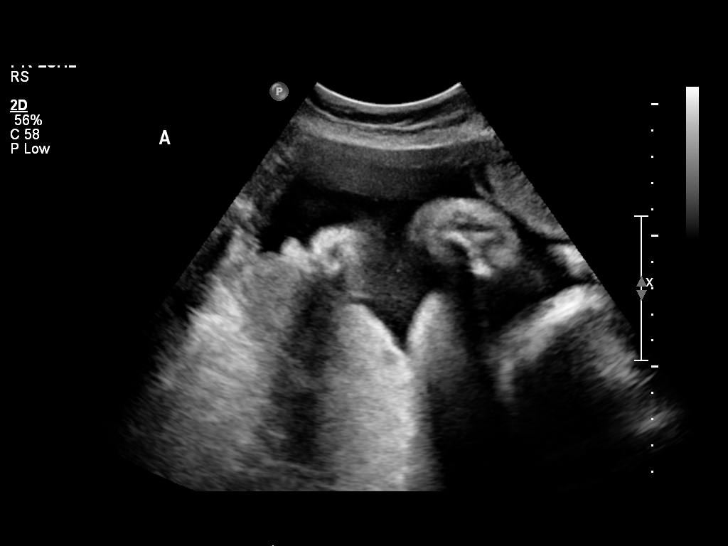
[im 4/29]
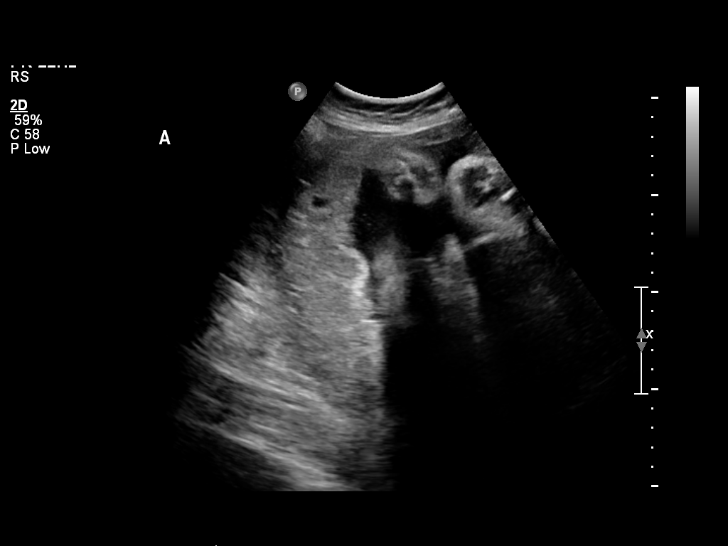
[im 6/29]
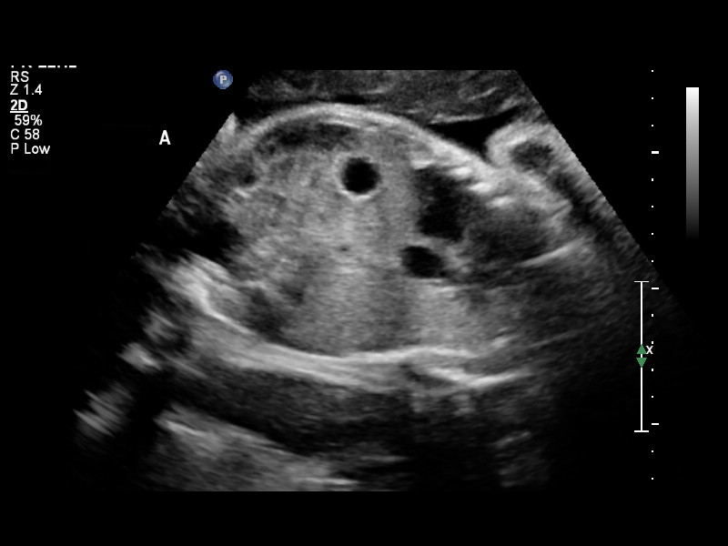
[im 8/29]
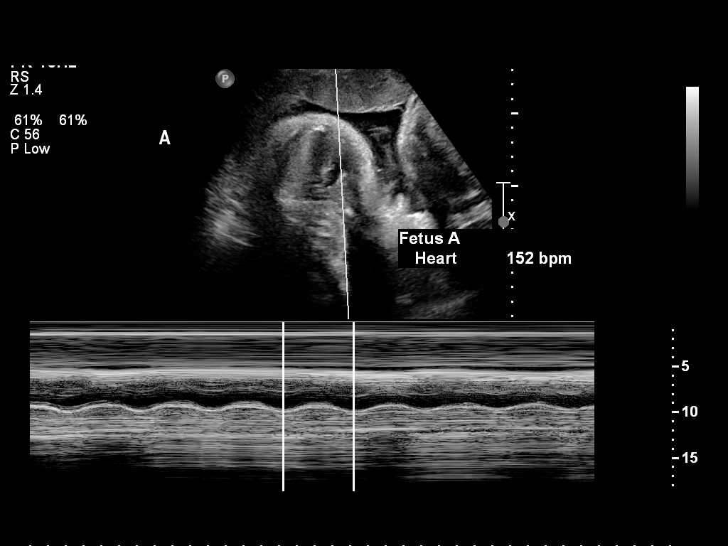
[im 10/29]
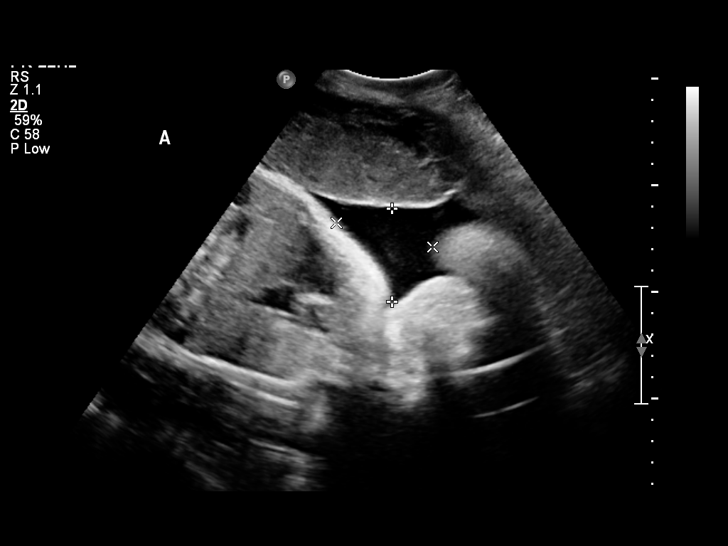
[im 12/29]
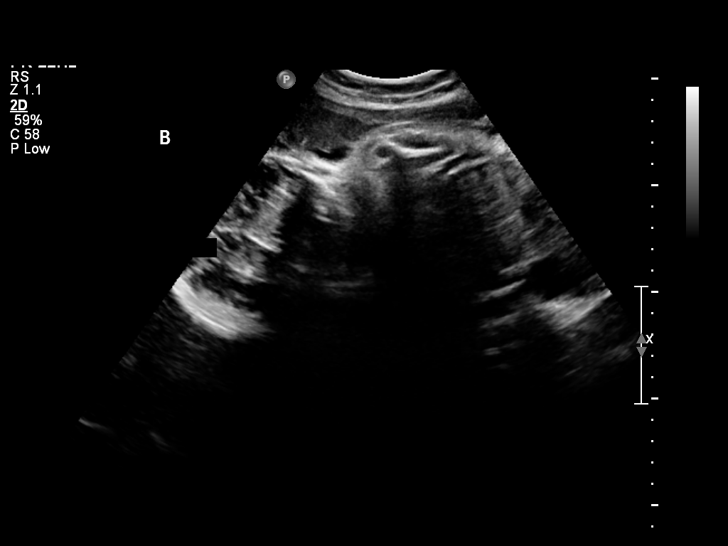
[im 15/29]
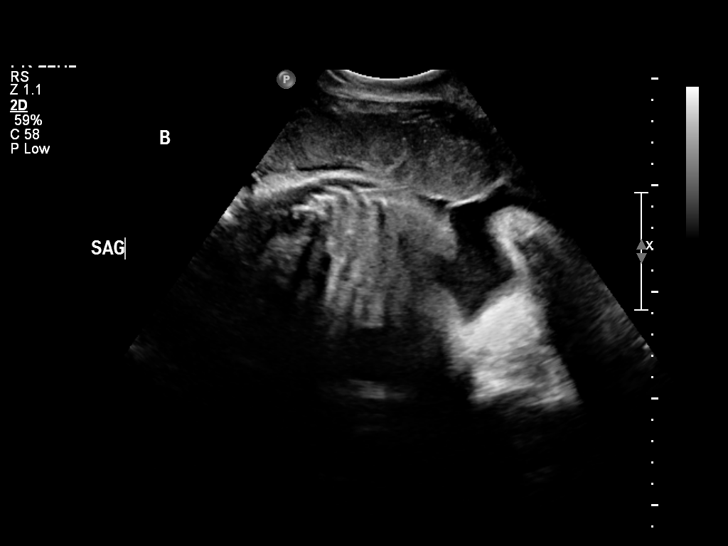
[im 17/29]
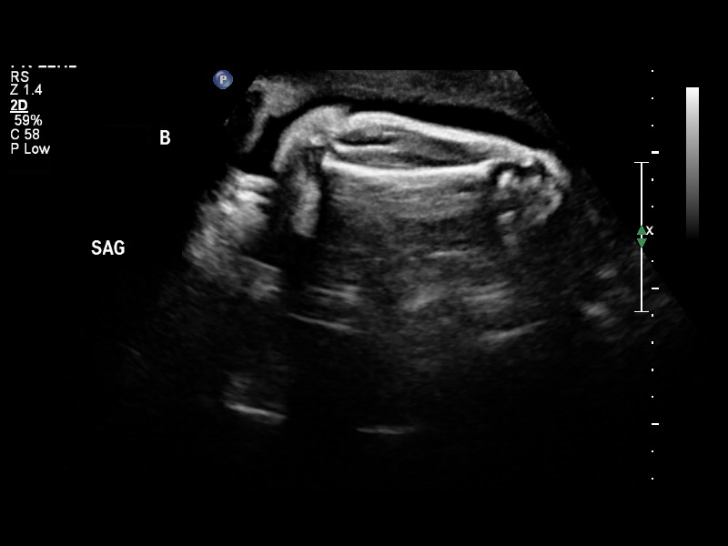
[im 19/29]
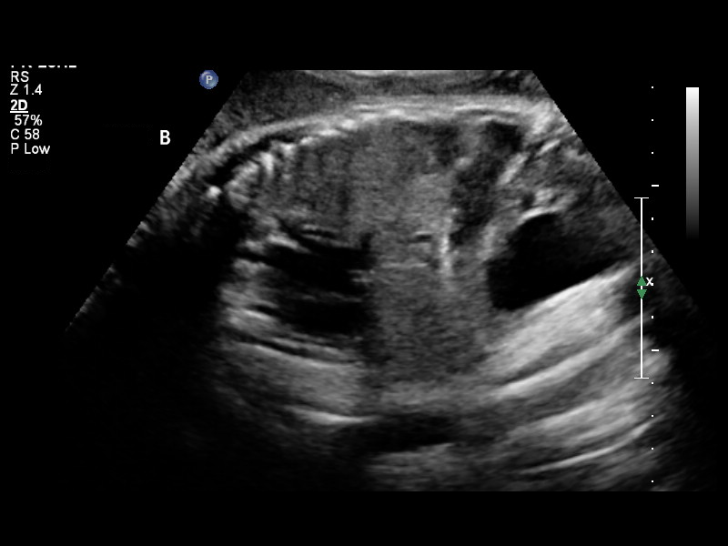
[im 21/29]
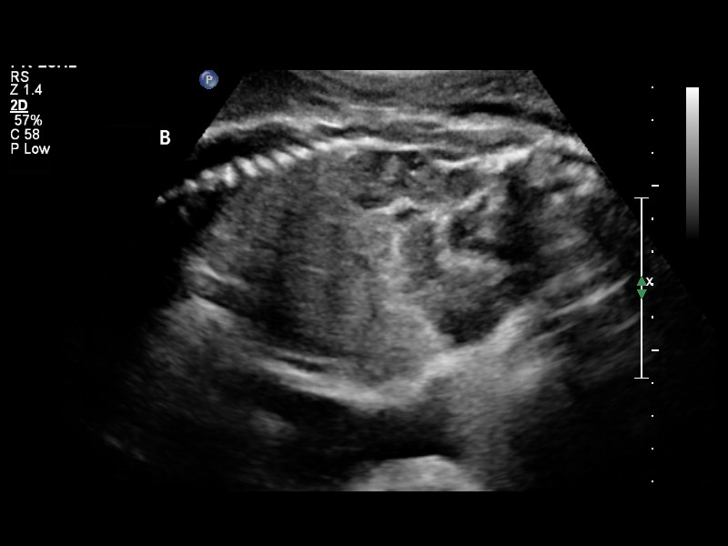
[im 23/29]
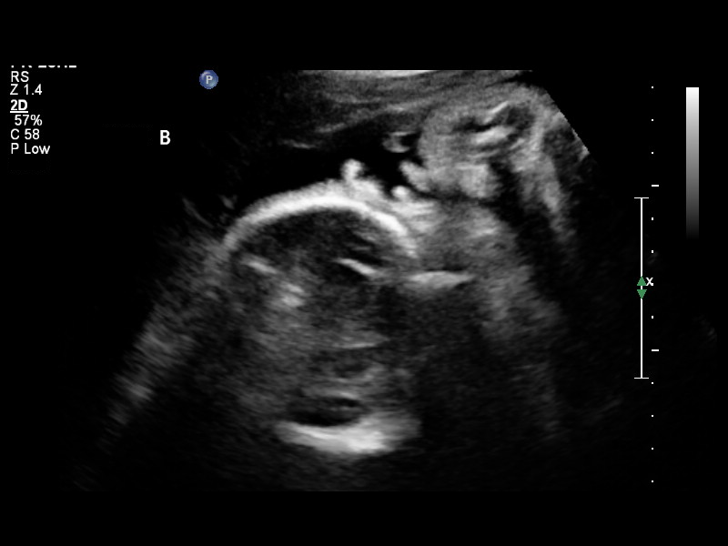
[im 25/29]
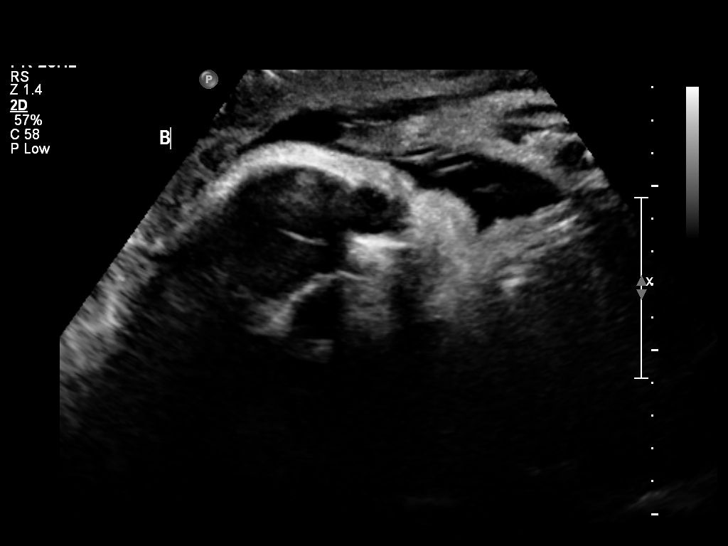
[im 27/29]
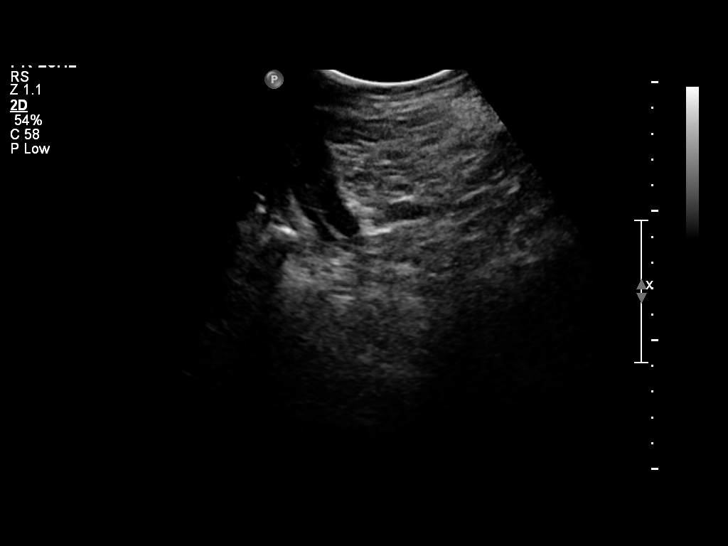

[13 of 28 positions shown; findings below may reference images not displayed]

OBSTETRICS REPORT
                      (Signed Final 02/04/2011 [DATE])

 Order#:         [id number]
                 20_O
Procedures

 US FETAL BPP ADD'L GEST                               76819.2
Indications

 Non-reactive NST, FHR decelerations
 Twin gestation, Di-Di
Fetal Evaluation (Fetus A)

 Fetal Heart Rate:  152                          bpm
 Cardiac Activity:  Observed
 Fetal Lie:         Maternal right side
 Presentation:      Cephalic
 Placenta:          Posterior, above cervical
                    os

 Membrane Desc:     Dividing
                    Membrane seen -
                    Dichorionic.

 Amniotic Fluid
 AFI FV:      Subjectively within normal limits
                                              Larg Pckt:    4.4  cm
Biophysical Evaluation (Fetus A)

 Amniotic F.V:   Pocket => 2 cm two         F. Tone:         Observed
                 planes
 F. Movement:    Observed                   Score:           [DATE]
 F. Breathing:   Observed
Gestational Age (Fetus A)

 LMP:           36w 0d        Date:  05/28/10                 EDD:   03/04/11
 Best:          35w 3d     Det. By:  Early Ultrasound         EDD:   03/08/11
Fetal Evaluation (Fetus B)
 Fetal Heart Rate:  140                          bpm
 Cardiac Activity:  Observed
 Fetal Lie:         SUPERIOR  Left Fetus
 Presentation:      Breech Oblique
 Placenta:          Anterior, above cervical os

 Membrane Desc:     Dividing
                    Membrane seen -
                    Dichorionic.

 Amniotic Fluid
 AFI FV:      Subjectively within normal limits
                                              Larg Pckt:    3.3  cm
Biophysical Evaluation (Fetus B)

 Amniotic F.V:   Pocket => 2 cm two         F. Tone:         Observed
                 planes
 F. Movement:    Observed                   Score:           [DATE]
 F. Breathing:   Observed
Gestational Age (Fetus B)

 LMP:           36w 0d        Date:  05/28/10                 EDD:   03/04/11
 Best:          35w 3d     Det. By:  Early Ultrasound         EDD:   03/08/11
Cervix Uterus Adnexa

 Cervix:       Not visualized (advanced GA >34 wks)

 Adnexa:     No abnormality visualized.
Impression

 Living di di twin intrauterine pregnancy. Presenting twin A in
 cephalic presentation. BPP [DATE] for both twins.
 Sonographer Jexon Efrain Micolta called called to Dr. Emelia at

 questions or concerns.

## 2013-04-01 IMAGING — US US FETAL BPP W/O NONSTRESS
1 series · 13 of 23 positions shown · non-contrast
Comparison: none

[Series 1: us fetal bpp w/o nonstress · non-contrast · 23 acquisitions, 13 frames shown]
[im 1/23]
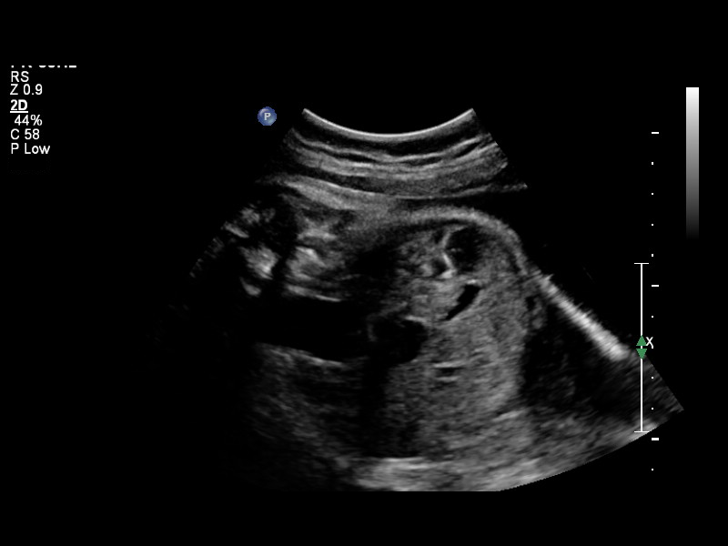
[im 3/23]
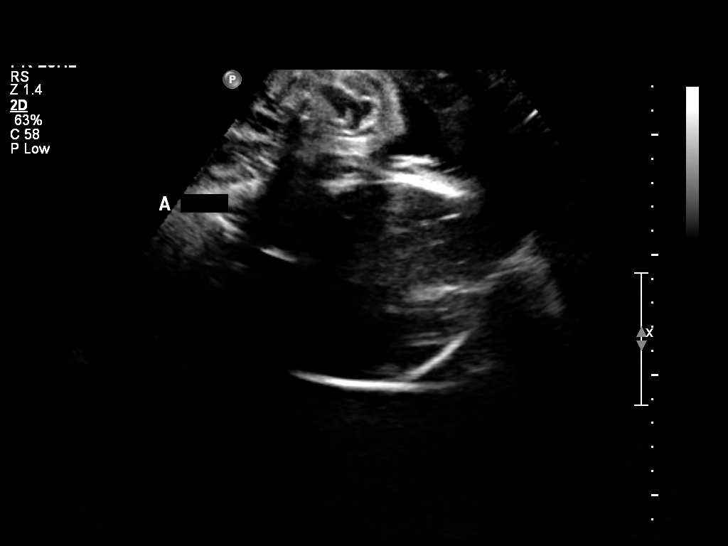
[im 5/23]
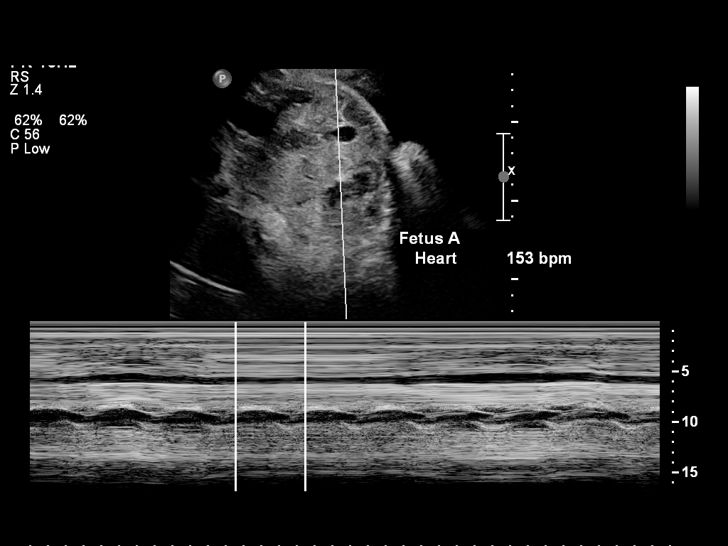
[im 7/23]
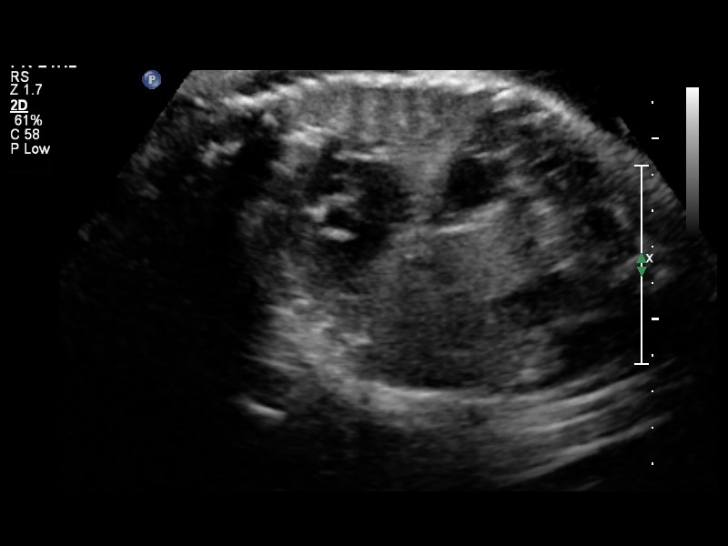
[im 8/23]
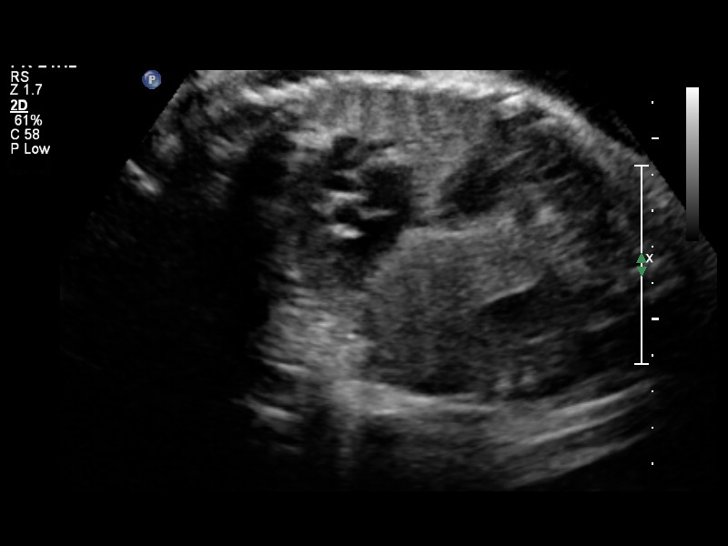
[im 10/23]
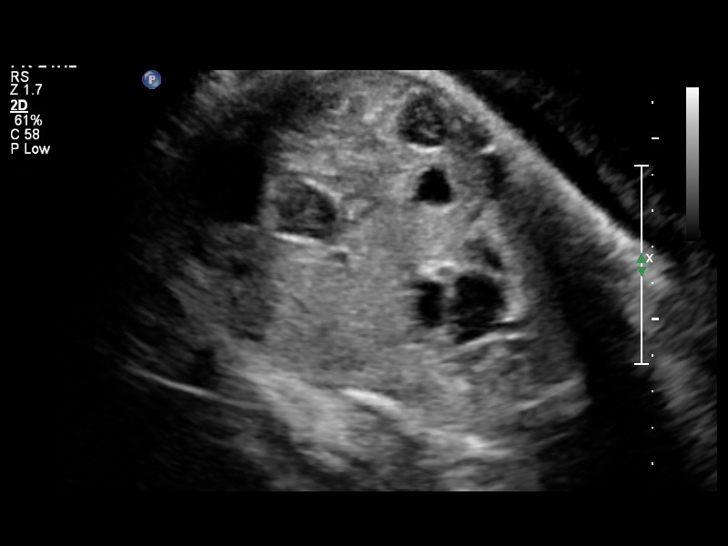
[im 12/23]
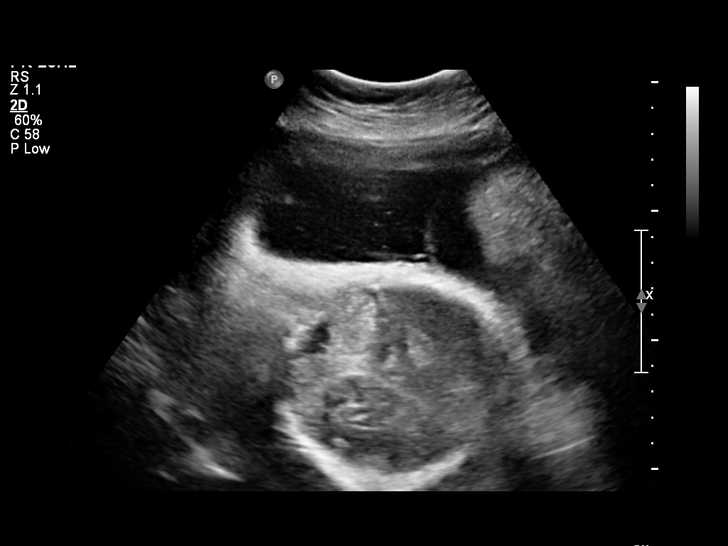
[im 14/23]
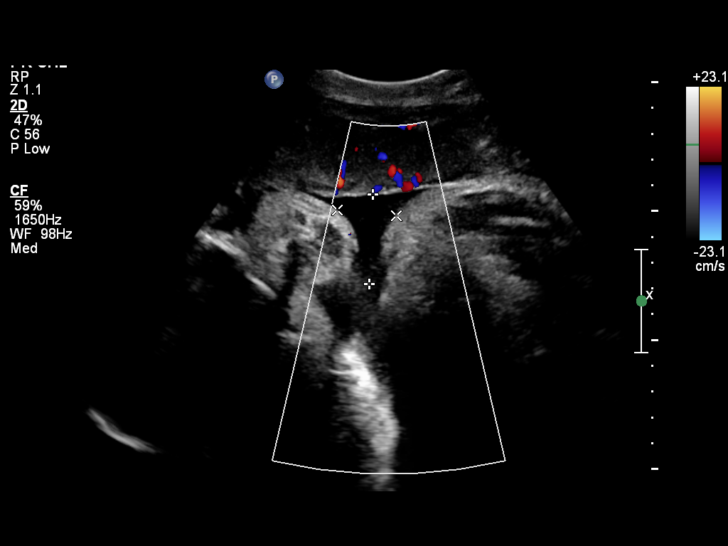
[im 16/23]
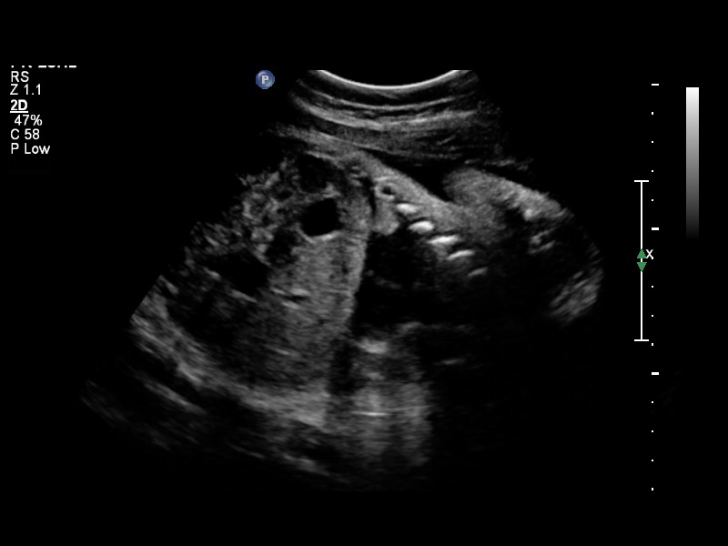
[im 17/23]
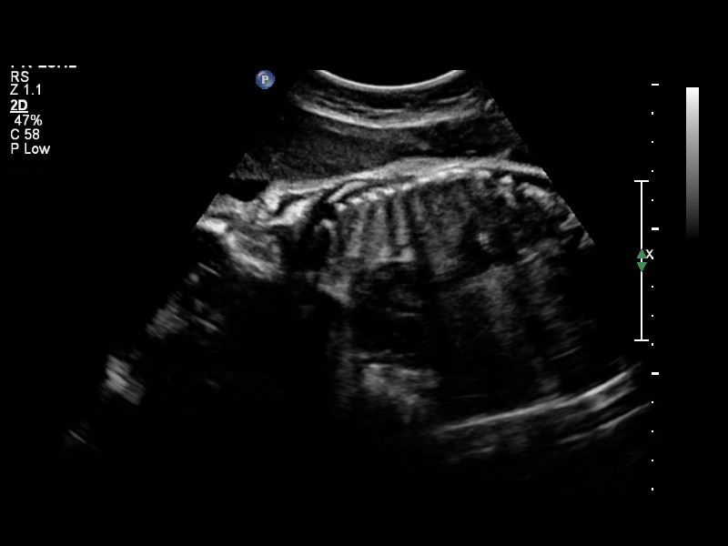
[im 19/23]
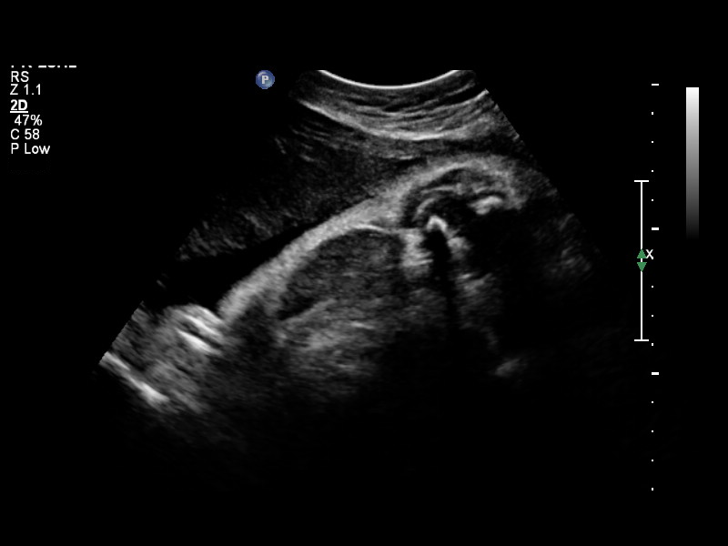
[im 21/23]
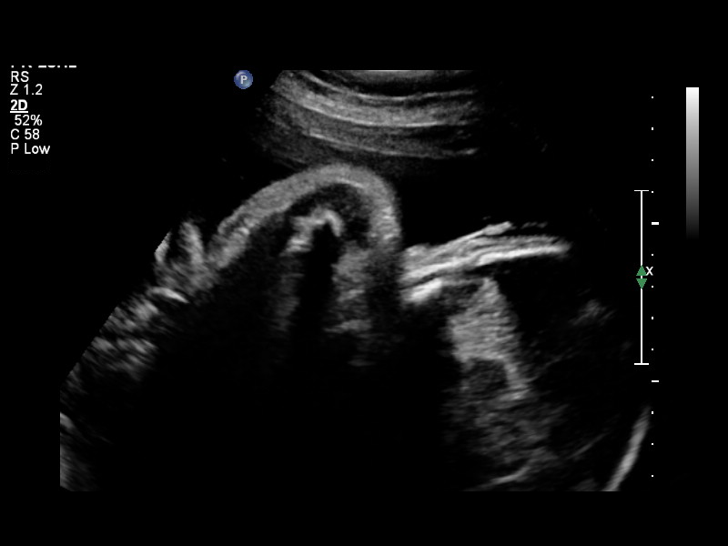
[im 23/23]
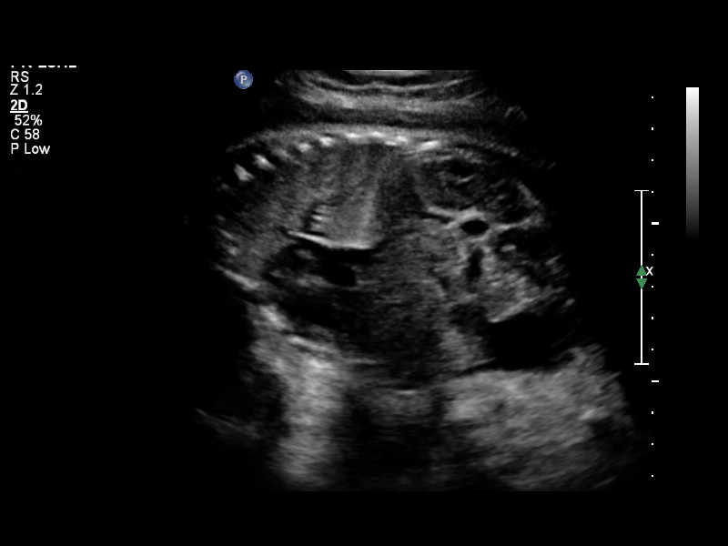

[13 of 23 positions shown; findings below may reference images not displayed]

OBSTETRICS REPORT
                      (Signed Final 02/07/2011 [DATE])

 Name:    JULIAN PORTALATIN BALDAYAKE                Visit Date: 02/07/2011 [DATE]

 Order#:         [id number]
                 27_O
Procedures

 US FETAL BPP ADD'L GEST                               76819.2
Indications

 Non-reactive NST, FHR decelerations x 2
 Twin gestation, Di-Di
 Assess fetal well being
Fetal Evaluation (Fetus A)

 Fetal Heart Rate:  153                          bpm
 Cardiac Activity:  Observed
 Fetal Lie:         Maternal Rt side
 Presentation:      Cephalic

 Amniotic Fluid
 AFI FV:      Subjectively within normal limits
                                              Larg Pckt:    4.2  cm
Biophysical Evaluation (Fetus A)

 Amniotic F.V:   Within normal limits       F. Tone:         Observed
 F. Movement:    Observed                   Score:           [DATE]
 F. Breathing:   Observed
Gestational Age (Fetus A)

 LMP:           36w 3d        Date:  05/28/10                 EDD:   03/04/11
 Best:          35w 6d     Det. By:  Early Ultrasound         EDD:   03/08/11

Fetal Evaluation (Fetus B)

 Fetal Heart Rate:  147                          bpm
 Cardiac Activity:  Observed
 Fetal Lie:         Superior Left Fetus
 Presentation:      Breech
 Amniotic Fluid
 AFI FV:      Subjectively within normal limits
                                              Larg Pckt:    3.4  cm
Biophysical Evaluation (Fetus B)

 Amniotic F.V:   Within normal limits       F. Tone:         Observed
 F. Movement:    Observed                   Score:           [DATE]
 F. Breathing:   Observed
Gestational Age (Fetus B)

 LMP:           36w 3d        Date:  05/28/10                 EDD:   03/04/11
 Best:          35w 6d     Det. By:  Early Ultrasound         EDD:   03/08/11
Impression

 Living di di twin gestation with presenting twin A in cephalic
 presentation. BPP [DATE] for both twins.

 questions or concerns.

## 2014-01-23 ENCOUNTER — Encounter (HOSPITAL_COMMUNITY): Payer: Self-pay

## 2014-08-08 ENCOUNTER — Other Ambulatory Visit: Payer: Self-pay | Admitting: Obstetrics and Gynecology

## 2014-08-08 DIAGNOSIS — Z803 Family history of malignant neoplasm of breast: Secondary | ICD-10-CM

## 2014-10-17 ENCOUNTER — Ambulatory Visit (INDEPENDENT_AMBULATORY_CARE_PROVIDER_SITE_OTHER): Payer: 59 | Admitting: Family Medicine

## 2014-10-17 VITALS — BP 100/68 | HR 94 | Temp 98.5°F | Resp 20 | Ht 65.0 in | Wt 189.0 lb

## 2014-10-17 DIAGNOSIS — R519 Headache, unspecified: Secondary | ICD-10-CM

## 2014-10-17 DIAGNOSIS — R51 Headache: Secondary | ICD-10-CM | POA: Diagnosis not present

## 2014-10-17 MED ORDER — SUMATRIPTAN SUCCINATE 50 MG PO TABS: 50.0000 mg | ORAL_TABLET | ORAL | Status: DC | PRN

## 2014-10-17 NOTE — Patient Instructions (Signed)
Try tylenol 500 mg every 8 hours and or ibuprofen 800 mg every 8 hours with food Or Excerdin Migrain Headache by itself IF no improvement then can use imitrex  Headaches, Frequently Asked Questions MIGRAINE HEADACHES Q: What is migraine? What causes it? How can I treat it? A: Generally, migraine headaches begin as a dull ache. Then they develop into a constant, throbbing, and pulsating pain. You may experience pain at the temples. You may experience pain at the front or back of one or both sides of the head. The pain is usually accompanied by a combination of:  Nausea.  Vomiting.  Sensitivity to light and noise. Some people (about 15%) experience an aura (see below) before an attack. The cause of migraine is believed to be chemical reactions in the brain. Treatment for migraine may include over-the-counter or prescription medications. It may also include self-help techniques. These include relaxation training and biofeedback.  Q: What is an aura? A: About 15% of people with migraine get an "aura". This is a sign of neurological symptoms that occur before a migraine headache. You may see wavy or jagged lines, dots, or flashing lights. You might experience tunnel vision or blind spots in one or both eyes. The aura can include visual or auditory hallucinations (something imagined). It may include disruptions in smell (such as strange odors), taste or touch. Other symptoms include:  Numbness.  A "pins and needles" sensation.  Difficulty in recalling or speaking the correct word. These neurological events may last as long as 60 minutes. These symptoms will fade as the headache begins. Q: What is a trigger? A: Certain physical or environmental factors can lead to or "trigger" a migraine. These include:  Foods.  Hormonal changes.  Weather.  Stress. It is important to remember that triggers are different for everyone. To help prevent migraine attacks, you need to figure out which triggers  affect you. Keep a headache diary. This is a good way to track triggers. The diary will help you talk to your healthcare professional about your condition. Q: Does weather affect migraines? A: Bright sunshine, hot, humid conditions, and drastic changes in barometric pressure may lead to, or "trigger," a migraine attack in some people. But studies have shown that weather does not act as a trigger for everyone with migraines. Q: What is the link between migraine and hormones? A: Hormones start and regulate many of your body's functions. Hormones keep your body in balance within a constantly changing environment. The levels of hormones in your body are unbalanced at times. Examples are during menstruation, pregnancy, or menopause. That can lead to a migraine attack. In fact, about three quarters of all women with migraine report that their attacks are related to the menstrual cycle.  Q: Is there an increased risk of stroke for migraine sufferers? A: The likelihood of a migraine attack causing a stroke is very remote. That is not to say that migraine sufferers cannot have a stroke associated with their migraines. In persons under age 72, the most common associated factor for stroke is migraine headache. But over the course of a person's normal life span, the occurrence of migraine headache may actually be associated with a reduced risk of dying from cerebrovascular disease due to stroke.  Q: What are acute medications for migraine? A: Acute medications are used to treat the pain of the headache after it has started. Examples over-the-counter medications, NSAIDs, ergots, and triptans.  Q: What are the triptans? A: Triptans are the newest class  of abortive medications. They are specifically targeted to treat migraine. Triptans are vasoconstrictors. They moderate some chemical reactions in the brain. The triptans work on receptors in your brain. Triptans help to restore the balance of a neurotransmitter called  serotonin. Fluctuations in levels of serotonin are thought to be a main cause of migraine.  Q: Are over-the-counter medications for migraine effective? A: Over-the-counter, or "OTC," medications may be effective in relieving mild to moderate pain and associated symptoms of migraine. But you should see your caregiver before beginning any treatment regimen for migraine.  Q: What are preventive medications for migraine? A: Preventive medications for migraine are sometimes referred to as "prophylactic" treatments. They are used to reduce the frequency, severity, and length of migraine attacks. Examples of preventive medications include antiepileptic medications, antidepressants, beta-blockers, calcium channel blockers, and NSAIDs (nonsteroidal anti-inflammatory drugs). Q: Why are anticonvulsants used to treat migraine? A: During the past few years, there has been an increased interest in antiepileptic drugs for the prevention of migraine. They are sometimes referred to as "anticonvulsants". Both epilepsy and migraine may be caused by similar reactions in the brain.  Q: Why are antidepressants used to treat migraine? A: Antidepressants are typically used to treat people with depression. They may reduce migraine frequency by regulating chemical levels, such as serotonin, in the brain.  Q: What alternative therapies are used to treat migraine? A: The term "alternative therapies" is often used to describe treatments considered outside the scope of conventional Western medicine. Examples of alternative therapy include acupuncture, acupressure, and yoga. Another common alternative treatment is herbal therapy. Some herbs are believed to relieve headache pain. Always discuss alternative therapies with your caregiver before proceeding. Some herbal products contain arsenic and other toxins. TENSION HEADACHES Q: What is a tension-type headache? What causes it? How can I treat it? A: Tension-type headaches occur  randomly. They are often the result of temporary stress, anxiety, fatigue, or anger. Symptoms include soreness in your temples, a tightening band-like sensation around your head (a "vice-like" ache). Symptoms can also include a pulling feeling, pressure sensations, and contracting head and neck muscles. The headache begins in your forehead, temples, or the back of your head and neck. Treatment for tension-type headache may include over-the-counter or prescription medications. Treatment may also include self-help techniques such as relaxation training and biofeedback. CLUSTER HEADACHES Q: What is a cluster headache? What causes it? How can I treat it? A: Cluster headache gets its name because the attacks come in groups. The pain arrives with little, if any, warning. It is usually on one side of the head. A tearing or bloodshot eye and a runny nose on the same side of the headache may also accompany the pain. Cluster headaches are believed to be caused by chemical reactions in the brain. They have been described as the most severe and intense of any headache type. Treatment for cluster headache includes prescription medication and oxygen. SINUS HEADACHES Q: What is a sinus headache? What causes it? How can I treat it? A: When a cavity in the bones of the face and skull (a sinus) becomes inflamed, the inflammation will cause localized pain. This condition is usually the result of an allergic reaction, a tumor, or an infection. If your headache is caused by a sinus blockage, such as an infection, you will probably have a fever. An x-ray will confirm a sinus blockage. Your caregiver's treatment might include antibiotics for the infection, as well as antihistamines or decongestants.  REBOUND HEADACHES Q: What  is a rebound headache? What causes it? How can I treat it? A: A pattern of taking acute headache medications too often can lead to a condition known as "rebound headache." A pattern of taking too much  headache medication includes taking it more than 2 days per week or in excessive amounts. That means more than the label or a caregiver advises. With rebound headaches, your medications not only stop relieving pain, they actually begin to cause headaches. Doctors treat rebound headache by tapering the medication that is being overused. Sometimes your caregiver will gradually substitute a different type of treatment or medication. Stopping may be a challenge. Regularly overusing a medication increases the potential for serious side effects. Consult a caregiver if you regularly use headache medications more than 2 days per week or more than the label advises. ADDITIONAL QUESTIONS AND ANSWERS Q: What is biofeedback? A: Biofeedback is a self-help treatment. Biofeedback uses special equipment to monitor your body's involuntary physical responses. Biofeedback monitors:  Breathing.  Pulse.  Heart rate.  Temperature.  Muscle tension.  Brain activity. Biofeedback helps you refine and perfect your relaxation exercises. You learn to control the physical responses that are related to stress. Once the technique has been mastered, you do not need the equipment any more. Q: Are headaches hereditary? A: Four out of five (80%) of people that suffer report a family history of migraine. Scientists are not sure if this is genetic or a family predisposition. Despite the uncertainty, a child has a 50% chance of having migraine if one parent suffers. The child has a 75% chance if both parents suffer.  Q: Can children get headaches? A: By the time they reach high school, most young people have experienced some type of headache. Many safe and effective approaches or medications can prevent a headache from occurring or stop it after it has begun.  Q: What type of doctor should I see to diagnose and treat my headache? A: Start with your primary caregiver. Discuss his or her experience and approach to headaches. Discuss  methods of classification, diagnosis, and treatment. Your caregiver may decide to recommend you to a headache specialist, depending upon your symptoms or other physical conditions. Having diabetes, allergies, etc., may require a more comprehensive and inclusive approach to your headache. The National Headache Foundation will provide, upon request, a list of [facility] physician members in your state. Document Released: 05/31/2003 Document Revised: 06/02/2011 Document Reviewed: 11/08/2007 [facility] Patient Information 2015 [place], [place]. This information is not intended to replace advice given to you by your health care provider. Make sure you discuss any questions you have with your health care provider.

## 2014-10-18 NOTE — Progress Notes (Signed)
Chief Complaint:  Chief Complaint  Patient presents with  . Migraine    couple of times in the last few days    HPI: Cheryl Harper is a 34 y.o. female who reports to [facility] today complaining of resolved 2 day history of migraine headaches, she had it constantly it was frontal and also diffuse in the back, took advil 2 pills x 2 and was improved . She does not rememebr having any photophobia,n/v/wweakness, cp, sob, slurred speech, diaphoresis, etc She has had a migraine similar to this before. Here today to make sure BP and vitals were normal and there was nothing wrong with her.  This was translated through her husband  Past Medical History  Diagnosis Date  . Female circumcision   . Hepatitis B antibody positive   . Headache(784.0)     migraines  . Family history of consanguinity     pt and husband are 3rd cousins   Past Surgical History  Procedure Laterality Date  . Cholecystectomy    . Labial reconstruction      and repair of cyst  . Tonsillectomy     History   Social History  . Marital Status: Married    Spouse Name: N/A  . Number of Children: N/A  . Years of Education: N/A   Social History Main Topics  . Smoking status: Never Smoker   . Smokeless tobacco: Never Used  . Alcohol Use: No  . Drug Use: No  . Sexual Activity: Yes   Other Topics Concern  . None   Social History Narrative   Family History  Problem Relation Age of Onset  . Cancer Mother     breast  . Diabetes Father   . Heart attack Father   . Heart attack Paternal Aunt   . Heart attack Paternal Uncle   . Anesthesia problems Neg Hx   . Hypotension Neg Hx   . Malignant hyperthermia Neg Hx   . Pseudochol deficiency Neg Hx    No Known Allergies Prior to Admission medications   Medication Sig Start Date End Date Taking? Authorizing Provider  prenatal vitamin w/FE, FA (PRENATAL 1 + 1) 27-1 MG TABS Take 1 tablet by mouth daily.      Historical Provider, MD  SUMAtriptan (IMITREX) 50 MG  tablet Take 1 tablet (50 mg total) by mouth every 2 (two) hours as needed for migraine (NO more than 200 mg daily). May repeat in 2 hours if headache persists or recurs. 10/17/14   Serria Sloma P Sharlot Sturkey, DO     ROS: The patient denies fevers, chills, night sweats, unintentional weight loss, chest pain, palpitations, wheezing, dyspnea on exertion, nausea, vomiting, abdominal pain, dysuria, hematuria, melena, numbness, weakness, or tingling.   All other systems have been reviewed and were otherwise negative with the exception of those mentioned in the HPI and as above.    PHYSICAL EXAM: Filed Vitals:   10/17/14 1958  BP: 100/68  Pulse: 94  Temp: 98.5 F (36.9 C)  Resp: 20   Body mass index is 31.45 kg/(m^2).   General: Alert, no acute distress HEENT:  Normocephalic, atraumatic, oropharynx patent. EOMI, PERRLA, fundo exam normal Cardiovascular:  Regular rate and rhythm, no rubs murmurs or gallops.  No Carotid bruits, radial pulse intact. No pedal edema.  Respiratory: Clear to auscultation bilaterally.  No wheezes, rales, or rhonchi.  No cyanosis, no use of accessory musculature Abdominal: No organomegaly, abdomen is soft and non-tender, positive bowel sounds. No masses.  Skin: No rashes. Neurologic: Facial musculature symmetric.CN 2-12 grossly normal Psychiatric: Patient acts appropriately throughout our interaction. Lymphatic: No cervical or submandibular lymphadenopathy Musculoskeletal: Gait intact. No edema, tenderness 5/5 strength, sensation intact. Neg romberg Neg meningeal signs    LABS: Results for orders placed or performed during the hospital encounter of 02/20/11  CBC  Result Value Ref Range   WBC 7.3 4.0 - 10.5 K/uL   RBC 3.66 (L) 3.87 - 5.11 MIL/uL   Hemoglobin 11.4 (L) 12.0 - 15.0 g/dL   HCT 16.1 (L) 09.6 - 04.5 %   MCV 92.9 78.0 - 100.0 fL   MCH 31.1 26.0 - 34.0 pg   MCHC 33.5 30.0 - 36.0 g/dL   RDW 40.9 81.1 - 91.4 %   Platelets 221 150 - 400 K/uL  RPR  Result Value Ref  Range   RPR Ser Ql NON REACTIVE NON REACTIVE     EKG/XRAY:   Primary read interpreted by Dr. Conley Rolls at [facility].   ASSESSMENT/PLAN: Encounter Diagnosis  Name Primary?  . Acute nonintractable headache, unspecified headache type Yes   Advise to monitor for HA triggers May take excedrin, tylenol or other NSAIDs If no improvement with OTC meds then consider filling imitrex  Fu prn   Gross sideeffects, risk and benefits, and alternatives of medications d/w patient. Patient is aware that all medications have potential sideeffects and we are unable to predict every sideeffect or drug-drug interaction that may occur.  Halo Laski DO  10/18/2014 8:53 AM

## 2016-10-13 ENCOUNTER — Encounter (HOSPITAL_COMMUNITY): Payer: Self-pay

## 2016-10-13 ENCOUNTER — Emergency Department (HOSPITAL_COMMUNITY)
Admission: EM | Admit: 2016-10-13 | Discharge: 2016-10-13 | Disposition: A | Discharge status: Home / Self Care | Payer: Medicaid Other | Attending: Emergency Medicine | Admitting: Emergency Medicine

## 2016-10-13 DIAGNOSIS — N644 Mastodynia: Secondary | ICD-10-CM | POA: Diagnosis not present

## 2016-10-13 NOTE — ED Triage Notes (Signed)
Pt in right breast onset yesterday pain just below nipple sts no discharge, reports relief with ibuprofen but pain returns.  Grimaces on palpation, pt speaks arabic

## 2016-10-13 NOTE — ED Provider Notes (Signed)
[facility]-EMERGENCY DEPT Provider Note   CSN: [id number] Arrival date & time: 10/13/16  1625  History is obtained from professional medical interpreter. Patient speaks no [place]   History   Chief Complaint Chief Complaint  Patient presents with  . Breast Pain    HPI Cheryl Harper is a 36 y.o. female.HPI Complains of right-sided breast pain which started 3 days ago accompanied by chills and subjective fever. Treated with Advil yesterday with relief of pain pain is minimal today she also complains of left-sided breast pain today which is shooting in nature and mild. She has no left-sided breast pain presently. Nothing made symptoms better or worse. No other associated symptoms Past Medical History:  Diagnosis Date  . Family history of consanguinity    pt and husband are 3rd cousins  . Female circumcision   . Headache(784.0)    migraines  . Hepatitis B antibody positive     Patient Active Problem List   Diagnosis Date Noted  . SVD (spontaneous vaginal delivery) 02/20/2011  . Twin gestation, dichorionic diamniotic 01/23/2011  . UNSPECIFIED DISORDER OF THYROID 12/15/2007  . CHOLELITHIASIS 12/15/2007  . HEADACHE 09/07/2007  . ABDOMINAL PAIN, UNSPECIFIED SITE 09/07/2007    Past Surgical History:  Procedure Laterality Date  . CHOLECYSTECTOMY    . labial reconstruction     and repair of cyst  . TONSILLECTOMY      OB History    Gravida Para Term Preterm AB Living   3 3 3  0 0 4   SAB TAB Ectopic Multiple Live Births   0 0 0 1 4       Home Medications    Prior to Admission medications   Medication Sig Start Date End Date Taking? Authorizing Provider  ibuprofen (ADVIL,MOTRIN) 200 MG tablet Take 600 mg by mouth every 6 (six) hours as needed for moderate pain.   Yes [provider]  SUMAtriptan (IMITREX) 50 MG tablet Take 1 tablet (50 mg total) by mouth every 2 (two) hours as needed for migraine (NO more than 200 mg daily). May repeat in 2 hours if headache  persists or recurs. 10/17/14  Yes Le, Thao P, DO    Family History Family History  Problem Relation Age of Onset  . Cancer Mother        breast  . Diabetes Father   . Heart attack Father   . Heart attack Paternal Aunt   . Heart attack Paternal Uncle   . Anesthesia problems Neg Hx   . Hypotension Neg Hx   . Malignant hyperthermia Neg Hx   . Pseudochol deficiency Neg Hx     Social History Social History  Substance Use Topics  . Smoking status: Never Smoker  . Smokeless tobacco: Never Used  . Alcohol use No     Allergies   Patient has no known allergies.   Review of Systems Review of Systems  Constitutional: Positive for fever.       Subjective fever  HENT: Negative.   Respiratory: Negative.   Cardiovascular: Negative.   Gastrointestinal: Negative.   Endocrine:       Breast pain  Musculoskeletal: Negative.   Skin: Negative.   Neurological: Negative.   Psychiatric/Behavioral: Negative.   All other systems reviewed and are negative.    Physical Exam Updated Vital Signs BP 125/72 (BP Location: Right Arm)   Pulse 96   Temp 99 F (37.2 C) (Oral)   Resp 16   LMP 09/29/2016   SpO2 100%   Physical  Exam  Constitutional: She appears well-developed and well-nourished.  HENT:  Head: Normocephalic and atraumatic.  Eyes: Pupils are equal, round, and reactive to light. Conjunctivae are normal.  Neck: Neck supple. No tracheal deviation present. No thyromegaly present.  Cardiovascular: Normal rate and regular rhythm.   No murmur heard. Pulmonary/Chest: Effort normal and breath sounds normal.  Abdominal: Soft. Bowel sounds are normal. She exhibits no distension. There is no tenderness.  Musculoskeletal: Normal range of motion. She exhibits no edema or tenderness.  Neurological: She is alert. Coordination normal.  Skin: Skin is warm and dry. No rash noted.  Bilateral breasts without tenderness or masses or redness. Nipples are normal. No axillary nodes  Psychiatric:  She has a normal mood and affect.  Nursing note and vitals reviewed.    ED Treatments / Results  Labs (all labs ordered are listed, but only abnormal results are displayed) Labs Reviewed - No data to display  EKG  EKG Interpretation None       Radiology No results found.  Procedures Procedures (including critical care time)  Medications Ordered in ED Medications - No data to display   Initial Impression / Assessment and Plan / ED Course  I have reviewed the triage vital signs and the nursing notes.  Pertinent labs & imaging results that were available during my care of the patient were reviewed by me and considered in my medical decision making (see chart for details).     Patient feels improved today over yesterday. Plan continue ibuprofen as needed. She is referred back to her obstetrician who delivered her child 2 years ago or [facility]. I suggest that she may need a mammogram  Final Clinical Impressions(s) / ED Diagnoses  Diagnosis breast pain Final diagnoses:  Breast pain    New Prescriptions New Prescriptions   No medications on file     Doug Sou, MD 10/13/16 2308

## 2016-10-13 NOTE — Discharge Instructions (Signed)
It is okay to take  as directed for pain. Call your obstetrician who delivered child or you can call the [facility] to schedule appointment. We suggest that you should have a mammogram

## 2016-10-13 NOTE — ED Notes (Signed)
Pt getting dressed in hospital gown.

## 2016-10-13 NOTE — ED Notes (Signed)
Pt to nurses station requesting time frame to see MD

## 2016-11-07 ENCOUNTER — Encounter: Payer: Medicaid Other | Admitting: Obstetrics and Gynecology

## 2016-12-23 ENCOUNTER — Other Ambulatory Visit: Payer: Self-pay | Admitting: Family Medicine

## 2016-12-23 DIAGNOSIS — N6310 Unspecified lump in the right breast, unspecified quadrant: Secondary | ICD-10-CM

## 2016-12-26 ENCOUNTER — Other Ambulatory Visit: Payer: Self-pay

## 2017-03-24 NOTE — L&D Delivery Note (Signed)
Delivery Note At 2:36 AM a viable female was delivered via Vaginal, Spontaneous (Presentation: vtx; LOA ).  APGAR: 9, 9; weight pending.   Placenta status: spontaneous, intact.  Cord:  with the following complications: none.  She had anterior scar tissue from previous genital alteration, this was incised to allow for delivery  Anesthesia: Local  Episiotomy: None Lacerations: Labial-essentially an anterior episiotomy was done through scar tissue, this was then repaired to try to create separate labia Suture Repair: 3.0 vicryl rapide Est. Blood Loss (mL):  215  Mom to postpartum.  Baby to Couplet care / Skin to Skin.  They do not want him circumcised.  Leighton Roachodd D Taleia Sadowski 02/03/2018, 3:10 AM

## 2017-07-23 LAB — OB RESULTS CONSOLE GC/CHLAMYDIA
CHLAMYDIA, DNA PROBE: NEGATIVE
GC PROBE AMP, GENITAL: NEGATIVE

## 2017-07-23 LAB — OB RESULTS CONSOLE ABO/RH: RH Type: POSITIVE

## 2017-07-23 LAB — OB RESULTS CONSOLE RUBELLA ANTIBODY, IGM: Rubella: IMMUNE

## 2017-07-23 LAB — OB RESULTS CONSOLE RPR: RPR: NONREACTIVE

## 2017-07-23 LAB — OB RESULTS CONSOLE ANTIBODY SCREEN: ANTIBODY SCREEN: NEGATIVE

## 2017-07-23 LAB — OB RESULTS CONSOLE HIV ANTIBODY (ROUTINE TESTING): HIV: NONREACTIVE

## 2017-07-23 LAB — OB RESULTS CONSOLE HEPATITIS B SURFACE ANTIGEN: HEP B S AG: POSITIVE

## 2017-12-13 ENCOUNTER — Encounter (HOSPITAL_COMMUNITY): Payer: Self-pay | Admitting: *Deleted

## 2017-12-13 ENCOUNTER — Other Ambulatory Visit: Payer: Self-pay

## 2017-12-13 ENCOUNTER — Inpatient Hospital Stay (HOSPITAL_COMMUNITY)
Admission: AD | Admit: 2017-12-13 | Discharge: 2017-12-13 | Disposition: A | Discharge status: Home / Self Care | Payer: Managed Care, Other (non HMO) | Source: Ambulatory Visit | Attending: Obstetrics and Gynecology | Admitting: Obstetrics and Gynecology

## 2017-12-13 DIAGNOSIS — O26893 Other specified pregnancy related conditions, third trimester: Secondary | ICD-10-CM

## 2017-12-13 DIAGNOSIS — O99513 Diseases of the respiratory system complicating pregnancy, third trimester: Secondary | ICD-10-CM | POA: Diagnosis not present

## 2017-12-13 DIAGNOSIS — J069 Acute upper respiratory infection, unspecified: Secondary | ICD-10-CM | POA: Diagnosis not present

## 2017-12-13 DIAGNOSIS — Z3A32 32 weeks gestation of pregnancy: Secondary | ICD-10-CM | POA: Diagnosis not present

## 2017-12-13 DIAGNOSIS — B9789 Other viral agents as the cause of diseases classified elsewhere: Secondary | ICD-10-CM

## 2017-12-13 LAB — URINALYSIS, ROUTINE W REFLEX MICROSCOPIC
Bilirubin Urine: NEGATIVE
HGB URINE DIPSTICK: NEGATIVE
Ketones, ur: NEGATIVE mg/dL
Leukocytes, UA: NEGATIVE
Nitrite: NEGATIVE
Protein, ur: NEGATIVE mg/dL
SPECIFIC GRAVITY, URINE: 1.018 (ref 1.005–1.030)
pH: 6 (ref 5.0–8.0)

## 2017-12-13 NOTE — MAU Note (Addendum)
Cheryl Harper is a 37 y.o. at [id number] here in MAU reporting: sore throat, dry cough and vomiting Denies fever. Onset of complaint: started Friday; has gotten worse Pain score: sore throat 9/10 Vitals:   12/13/17 1602  BP: 119/66  Pulse: 99  Resp: 20  Temp: 98.1 F (36.7 C)  SpO2: 100%      Lab orders placed from triage: ua  Patient desires family member Zaher to interpret for her. Waiver for interpretive services obtained. Language Arabic.

## 2017-12-13 NOTE — MAU Provider Note (Signed)
History     CSN: [id number]  Arrival date and time: 12/13/17 1545   First Provider Initiated Contact with Patient 12/13/17 1623    Chief Complaint  Patient presents with  . Emesis  . Cough  . Sore Throat   HPI Cheryl Harper is a 37 y.o. [id number] at 5932w0d who presents with a sore throat, cough and vomiting. She states the symptoms started Friday and have gradually gotten worse. She states she threw up once yesterday and once today after a coughing spell. She denies any fever, chills or runny nose. Reports that one of her children has the same symptoms. She denies any abdominal pain, vaginal bleeding or discharge. Reports normal fetal movement. She has not tried any medication for the symptoms because she did not know what was safe in pregnancy.   OB History    Gravida  5   Para  3   Term  3   Preterm  0   AB  0   Living  4     SAB  0   TAB  0   Ectopic  0   Multiple  1   Live Births  4           Past Medical History:  Diagnosis Date  . Family history of consanguinity    pt and husband are 3rd cousins  . Female circumcision   . Gestational diabetes   . Headache(784.0)    migraines  . Hepatitis B antibody positive     Past Surgical History:  Procedure Laterality Date  . CHOLECYSTECTOMY    . labial reconstruction     and repair of cyst  . TONSILLECTOMY      Family History  Problem Relation Age of Onset  . Cancer Mother        breast  . Diabetes Father   . Heart attack Father   . Heart attack Paternal Aunt   . Heart attack Paternal Uncle   . Anesthesia problems Neg Hx   . Hypotension Neg Hx   . Malignant hyperthermia Neg Hx   . Pseudochol deficiency Neg Hx     Social History   Tobacco Use  . Smoking status: Never Smoker  . Smokeless tobacco: Never Used  Substance Use Topics  . Alcohol use: No  . Drug use: No    Allergies: No Known Allergies  Medications Prior to Admission  Medication Sig Dispense Refill Last Dose  . ibuprofen  (ADVIL,MOTRIN) 200 MG tablet Take 600 mg by mouth every 6 (six) hours as needed for moderate pain.   Unknown at Unknown time  . SUMAtriptan (IMITREX) 50 MG tablet Take 1 tablet (50 mg total) by mouth every 2 (two) hours as needed for migraine (NO more than 200 mg daily). May repeat in 2 hours if headache persists or recurs. 10 tablet 0 Unknown at Unknown time    Review of Systems  Constitutional: Negative.  Negative for fatigue and fever.  HENT: Positive for sore throat.   Respiratory: Positive for cough. Negative for shortness of breath.   Cardiovascular: Negative.  Negative for chest pain.  Gastrointestinal: Positive for vomiting. Negative for abdominal pain, constipation, diarrhea and nausea.  Genitourinary: Negative.  Negative for dysuria.  Neurological: Negative.  Negative for dizziness and headaches.   Physical Exam   Blood pressure 119/66, pulse 99, temperature 98.1 F (36.7 C), temperature source Oral, resp. rate 20, weight 85.8 kg, SpO2 100 %.  Physical Exam  Nursing note and vitals reviewed.  Constitutional: She is oriented to person, place, and time. She appears well-developed and well-nourished. No distress.  HENT:  Head: Normocephalic.  Right Ear: Tympanic membrane, external ear and ear canal normal.  Left Ear: Tympanic membrane, external ear and ear canal normal.  Mouth/Throat: Posterior oropharyngeal erythema present.  Eyes: Pupils are equal, round, and reactive to light.  Cardiovascular: Normal rate, regular rhythm and normal heart sounds.  Respiratory: Effort normal and breath sounds normal. No respiratory distress.  GI: Soft. Bowel sounds are normal. She exhibits no distension. There is no tenderness.  Neurological: She is alert and oriented to person, place, and time.  Skin: Skin is warm and dry.  Psychiatric: She has a normal mood and affect. Her behavior is normal. Judgment and thought content normal.   Fetal Tracing:  Baseline: 145 Variability:  moderate Accels: 15x15 Decels: none  Toco: 1 uc  MAU Course  Procedures Results for orders placed or performed during the hospital encounter of 12/13/17 (from the past 24 hour(s))  Urinalysis, Routine w reflex microscopic     Status: Abnormal   Collection Time: 12/13/17  4:10 PM  Result Value Ref Range   Color, Urine YELLOW YELLOW   APPearance CLEAR CLEAR   Specific Gravity, Urine 1.018 1.005 - 1.030   pH 6.0 5.0 - 8.0   Glucose, UA >=500 (A) NEGATIVE mg/dL   Hgb urine dipstick NEGATIVE NEGATIVE   Bilirubin Urine NEGATIVE NEGATIVE   Ketones, ur NEGATIVE NEGATIVE mg/dL   Protein, ur NEGATIVE NEGATIVE mg/dL   Nitrite NEGATIVE NEGATIVE   Leukocytes, UA NEGATIVE NEGATIVE   WBC, UA 0-5 0 - 5 WBC/hpf   Bacteria, UA RARE (A) NONE SEEN   Squamous Epithelial / LPF 0-5 0 - 5   Mucus PRESENT    MDM Prenatal records from private office reviewed. Pregnancy uncomplicated. Labs ordered and reviewed.  UA Low suspicion for strep throat or flu with lack of fever and clinical presentation Consulted with Dr. Senaida Ores- ok to discharge patient home with OTC options for symptom treatment.   Assessment and Plan   1. Viral URI with cough   2. [redacted] weeks gestation of pregnancy    -Discharge home in stable condition -OTC options reviewed with patient, list given -Patient advised to follow-up with [facility] as scheduled for prenatal care -Patient may return to MAU as needed or if her condition were to change or worsen  Rolm Bookbinder CNM 12/13/2017, 4:23 PM

## 2017-12-13 NOTE — Discharge Instructions (Signed)

## 2017-12-17 ENCOUNTER — Encounter: Payer: Managed Care, Other (non HMO) | Attending: Obstetrics and Gynecology | Admitting: Registered"

## 2017-12-17 ENCOUNTER — Encounter: Payer: Self-pay | Admitting: Registered"

## 2017-12-17 DIAGNOSIS — O24419 Gestational diabetes mellitus in pregnancy, unspecified control: Secondary | ICD-10-CM | POA: Insufficient documentation

## 2017-12-17 DIAGNOSIS — O99810 Abnormal glucose complicating pregnancy: Secondary | ICD-10-CM | POA: Insufficient documentation

## 2017-12-17 DIAGNOSIS — Z713 Dietary counseling and surveillance: Secondary | ICD-10-CM | POA: Diagnosis present

## 2017-12-17 NOTE — Progress Notes (Signed)
Patient was seen on 12/17/2017 for Gestational Diabetes self-management education at the [facility]. The following learning objectives were met by the patient during this course:   States the definition of Gestational Diabetes  States why dietary management is important in controlling blood glucose  Describes the effects each nutrient has on blood glucose levels  Demonstrates ability to create a balanced meal plan  Demonstrates carbohydrate counting   States when to check blood glucose levels  Demonstrates proper blood glucose monitoring techniques  States the effect of stress and exercise on blood glucose levels  States the importance of limiting caffeine and abstaining from alcohol and smoking  Blood glucose monitor given: none (pt has at home)  Patient instructed to monitor glucose levels: FBS: 60 - <95; 1 hour: <140; 2 hour: <120  Patient received handouts:  Nutrition Diabetes and Pregnancy, including carb counting list  Patient will be seen for follow-up as needed.

## 2018-01-14 LAB — OB RESULTS CONSOLE GBS: GBS: NEGATIVE

## 2018-02-01 ENCOUNTER — Encounter (HOSPITAL_COMMUNITY): Payer: Self-pay | Admitting: *Deleted

## 2018-02-01 ENCOUNTER — Telehealth (HOSPITAL_COMMUNITY): Payer: Self-pay | Admitting: *Deleted

## 2018-02-01 NOTE — Telephone Encounter (Signed)
Preadmission screen [phone] interpreter number

## 2018-02-03 ENCOUNTER — Inpatient Hospital Stay (HOSPITAL_COMMUNITY)
Admission: AD | Admit: 2018-02-03 | Discharge: 2018-02-05 | DRG: 806 | Disposition: A | Discharge status: Home / Self Care | Payer: Managed Care, Other (non HMO) | Attending: Obstetrics and Gynecology | Admitting: Obstetrics and Gynecology

## 2018-02-03 DIAGNOSIS — O2243 Hemorrhoids in pregnancy, third trimester: Secondary | ICD-10-CM | POA: Diagnosis present

## 2018-02-03 DIAGNOSIS — Z3A39 39 weeks gestation of pregnancy: Secondary | ICD-10-CM

## 2018-02-03 DIAGNOSIS — O3483 Maternal care for other abnormalities of pelvic organs, third trimester: Secondary | ICD-10-CM | POA: Diagnosis present

## 2018-02-03 DIAGNOSIS — O24425 Gestational diabetes mellitus in childbirth, controlled by oral hypoglycemic drugs: Secondary | ICD-10-CM | POA: Diagnosis present

## 2018-02-03 DIAGNOSIS — N90810 Female genital mutilation status, unspecified: Secondary | ICD-10-CM | POA: Diagnosis present

## 2018-02-03 LAB — CBC
HEMATOCRIT: 37.1 % (ref 36.0–46.0)
HEMOGLOBIN: 12.5 g/dL (ref 12.0–15.0)
MCH: 30.6 pg (ref 26.0–34.0)
MCHC: 33.7 g/dL (ref 30.0–36.0)
MCV: 90.7 fL (ref 80.0–100.0)
NRBC: 0 % (ref 0.0–0.2)
Platelets: 215 10*3/uL (ref 150–400)
RBC: 4.09 MIL/uL (ref 3.87–5.11)
RDW: 13.7 % (ref 11.5–15.5)
WBC: 9 10*3/uL (ref 4.0–10.5)

## 2018-02-03 LAB — TYPE AND SCREEN
ABO/RH(D): A POS
ANTIBODY SCREEN: NEGATIVE

## 2018-02-03 LAB — RPR: RPR: NONREACTIVE

## 2018-02-03 LAB — ABO/RH: ABO/RH(D): A POS

## 2018-02-03 MED ORDER — LACTATED RINGERS IV SOLN: 500.0000 mL | INTRAVENOUS | Status: DC | PRN

## 2018-02-03 MED ORDER — LACTATED RINGERS IV SOLN: INTRAVENOUS | Status: DC

## 2018-02-03 MED ORDER — COCONUT OIL OIL: 1.0000 "application " | TOPICAL_OIL | Status: DC | PRN

## 2018-02-03 MED ORDER — MAGNESIUM HYDROXIDE 400 MG/5ML PO SUSP: 30.0000 mL | ORAL | Status: DC | PRN

## 2018-02-03 MED ORDER — BENZOCAINE-MENTHOL 20-0.5 % EX AERO
1.0000 "application " | INHALATION_SPRAY | CUTANEOUS | Status: DC | PRN
  Administered 2018-02-04: 1 via TOPICAL
  Filled 2018-02-03: qty 56

## 2018-02-03 MED ORDER — OXYTOCIN 40 UNITS IN LACTATED RINGERS INFUSION - SIMPLE MED: 2.5000 [IU]/h | INTRAVENOUS | Status: DC

## 2018-02-03 MED ORDER — ZOLPIDEM TARTRATE 5 MG PO TABS: 5.0000 mg | ORAL_TABLET | Freq: Every evening | ORAL | Status: DC | PRN

## 2018-02-03 MED ORDER — OXYCODONE-ACETAMINOPHEN 5-325 MG PO TABS: 2.0000 | ORAL_TABLET | ORAL | Status: DC | PRN

## 2018-02-03 MED ORDER — METHYLERGONOVINE MALEATE 0.2 MG/ML IJ SOLN: 0.2000 mg | INTRAMUSCULAR | Status: DC | PRN

## 2018-02-03 MED ORDER — ACETAMINOPHEN 325 MG PO TABS
650.0000 mg | ORAL_TABLET | ORAL | Status: DC | PRN
  Administered 2018-02-03 – 2018-02-05 (×3): 650 mg via ORAL
  Filled 2018-02-03 (×3): qty 2

## 2018-02-03 MED ORDER — TETANUS-DIPHTH-ACELL PERTUSSIS 5-2.5-18.5 LF-MCG/0.5 IM SUSP: 0.5000 mL | Freq: Once | INTRAMUSCULAR | Status: DC

## 2018-02-03 MED ORDER — DIPHENHYDRAMINE HCL 25 MG PO CAPS: 25.0000 mg | ORAL_CAPSULE | Freq: Four times a day (QID) | ORAL | Status: DC | PRN

## 2018-02-03 MED ORDER — DIBUCAINE 1 % RE OINT: 1.0000 "application " | TOPICAL_OINTMENT | RECTAL | Status: DC | PRN

## 2018-02-03 MED ORDER — OXYCODONE HCL 5 MG PO TABS: 5.0000 mg | ORAL_TABLET | ORAL | Status: DC | PRN

## 2018-02-03 MED ORDER — METHYLERGONOVINE MALEATE 0.2 MG PO TABS: 0.2000 mg | ORAL_TABLET | ORAL | Status: DC | PRN

## 2018-02-03 MED ORDER — SOD CITRATE-CITRIC ACID 500-334 MG/5ML PO SOLN: 30.0000 mL | ORAL | Status: DC | PRN

## 2018-02-03 MED ORDER — PRENATAL MULTIVITAMIN CH
1.0000 | ORAL_TABLET | Freq: Every day | ORAL | Status: DC
  Administered 2018-02-03 – 2018-02-05 (×3): 1 via ORAL
  Filled 2018-02-03 (×3): qty 1

## 2018-02-03 MED ORDER — WITCH HAZEL-GLYCERIN EX PADS: 1.0000 "application " | MEDICATED_PAD | CUTANEOUS | Status: DC | PRN

## 2018-02-03 MED ORDER — ONDANSETRON HCL 4 MG/2ML IJ SOLN: 4.0000 mg | Freq: Four times a day (QID) | INTRAMUSCULAR | Status: DC | PRN

## 2018-02-03 MED ORDER — OXYCODONE HCL 5 MG PO TABS: 10.0000 mg | ORAL_TABLET | ORAL | Status: DC | PRN

## 2018-02-03 MED ORDER — ONDANSETRON HCL 4 MG PO TABS: 4.0000 mg | ORAL_TABLET | ORAL | Status: DC | PRN

## 2018-02-03 MED ORDER — LIDOCAINE HCL (PF) 1 % IJ SOLN
INTRAMUSCULAR | Status: AC
  Filled 2018-02-03: qty 30

## 2018-02-03 MED ORDER — OXYTOCIN BOLUS FROM INFUSION
500.0000 mL | Freq: Once | INTRAVENOUS | Status: AC
  Administered 2018-02-03: 500 mL via INTRAVENOUS

## 2018-02-03 MED ORDER — SIMETHICONE 80 MG PO CHEW: 80.0000 mg | CHEWABLE_TABLET | ORAL | Status: DC | PRN

## 2018-02-03 MED ORDER — MEASLES, MUMPS & RUBELLA VAC IJ SOLR
0.5000 mL | Freq: Once | INTRAMUSCULAR | Status: DC
  Filled 2018-02-03: qty 0.5

## 2018-02-03 MED ORDER — IBUPROFEN 600 MG PO TABS
600.0000 mg | ORAL_TABLET | Freq: Four times a day (QID) | ORAL | Status: DC
  Administered 2018-02-03 – 2018-02-05 (×10): 600 mg via ORAL
  Filled 2018-02-03 (×10): qty 1

## 2018-02-03 MED ORDER — ONDANSETRON HCL 4 MG/2ML IJ SOLN: 4.0000 mg | INTRAMUSCULAR | Status: DC | PRN

## 2018-02-03 MED ORDER — OXYTOCIN 40 UNITS IN LACTATED RINGERS INFUSION - SIMPLE MED
INTRAVENOUS | Status: AC
  Filled 2018-02-03: qty 1000

## 2018-02-03 MED ORDER — FLEET ENEMA 7-19 GM/118ML RE ENEM: 1.0000 | ENEMA | RECTAL | Status: DC | PRN

## 2018-02-03 MED ORDER — OXYCODONE-ACETAMINOPHEN 5-325 MG PO TABS: 1.0000 | ORAL_TABLET | ORAL | Status: DC | PRN

## 2018-02-03 MED ORDER — ACETAMINOPHEN 325 MG PO TABS: 650.0000 mg | ORAL_TABLET | ORAL | Status: DC | PRN

## 2018-02-03 MED ORDER — LIDOCAINE HCL (PF) 1 % IJ SOLN
30.0000 mL | INTRAMUSCULAR | Status: AC | PRN
  Administered 2018-02-03: 30 mL via SUBCUTANEOUS
  Filled 2018-02-03: qty 30

## 2018-02-03 MED ORDER — SENNOSIDES-DOCUSATE SODIUM 8.6-50 MG PO TABS
2.0000 | ORAL_TABLET | ORAL | Status: DC
  Administered 2018-02-04 (×2): 2 via ORAL
  Filled 2018-02-03 (×2): qty 2

## 2018-02-03 NOTE — Lactation Note (Signed)
This note was copied from a baby's chart. Lactation Consultation Note  Patient Name: Cheryl Weyman CroonMayada Harper ZOXWR'UToday's Date: 02/03/2018 Reason for consult: Initial assessment;Term  Visited with P5 Mom of term baby at 469 hrs old.  Mom assisted to latch baby in football hold.  Mom experienced with breastfeeding her 4 other children, youngest is 7.  Reviewed breastfeeding basics (with FOB interpreting). Baby latched easily with small assistance to Mom.  Mom having some uterine cramping, encouraged her to keep her Motrin/tylenol on schedule.   Encouraged STS, and feeding baby often on cue.  Goal of 8-12 feedings per 24 hrs reviewed. Lactation brochure left in room.  Mom knows to call prn for assistance.  Consult Status Consult Status: Follow-up Date: 02/04/18 Follow-up type: In-patient    Judee ClaraSmith, Yani Lal E 02/03/2018, 11:36 AM

## 2018-02-03 NOTE — H&P (Signed)
Cheryl Harper is a 37 y.o. female, P3014, EGA 39+ weeks with EDC 11-17 presenting for ctx.  On eval in MAU, 8-9 cm dilated.  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  OB History    Gravida  5   Para  3   Term  3   Preterm  0   AB  0   Living  4     SAB  0   TAB  0   Ectopic  0   Multiple  1   Live Births  4          Past Medical History:  Diagnosis Date  . Family history of consanguinity    pt and husband are 3rd cousins  . Female circumcision   . Gestational diabetes   . Headache(784.0)    migraines  . Hepatitis B antibody positive    Past Surgical History:  Procedure Laterality Date  . CHOLECYSTECTOMY    . labial reconstruction     and repair of cyst  . TONSILLECTOMY     Family History: family history includes Cancer in her mother; Diabetes in her father; Down syndrome in her cousin; Heart attack in her father, paternal aunt, and paternal uncle; Learning disabilities in her daughter; Muscular dystrophy in her cousin. Social History:  reports that she has never smoked. She has never used smokeless tobacco. She reports that she does not drink alcohol or use drugs.     Maternal Diabetes: Yes:  Diabetes Type:  Insulin/Medication controlled Genetic Screening: Abnormal:  Results: Other: Maternal Ultrasounds/Referrals: Normal Fetal Ultrasounds or other Referrals:  None Maternal Substance Abuse:  No Significant Maternal Medications:  None Significant Maternal Lab Results:  Lab values include: Group B Strep negative Other Comments:  Panorama with low fetal fraction, she declined to repeat test  Review of Systems  Respiratory: Negative.   Cardiovascular: Negative.    Maternal Medical History:  Reason for admission: Rupture of membranes and contractions.   Contractions: Frequency: regular.   Perceived severity is strong.    Fetal activity: Perceived fetal activity  is normal.    Prenatal complications: no prenatal complications Prenatal Complications - Diabetes: gestational. Diabetes is managed by oral agent (monotherapy).      Dilation: 10 Station: 0 Exam by:: Wynelle BourgeoisMarie Williams, CNM Blood pressure 124/80, pulse 84, temperature 97.8 F (36.6 C), temperature source Axillary, resp. rate 18. Maternal Exam:  Uterine Assessment: Contraction strength is firm.  Contraction frequency is regular.   Abdomen: Patient reports no abdominal tenderness. Estimated fetal weight is 7 1/2.   Fetal presentation: vertex  Introitus: Normal vagina.  Amniotic fluid character: clear.  Pelvis: adequate for delivery.   Cervix: Cervix evaluated by digital exam.     Physical Exam  Constitutional: She appears well-developed and well-nourished.  Cardiovascular: Normal rate and regular rhythm.  Respiratory: Effort normal. No respiratory distress.  GI: Soft.    Prenatal labs: ABO, Rh: A/Positive/-- (05/02 0000) Antibody: Negative (05/02 0000) Rubella: Immune (05/02 0000) RPR: Nonreactive (05/02 0000)  HBsAg: Positive (05/02 0000)  HIV: Non-reactive (05/02 0000)  GBS: Negative (10/24 0000)   Assessment/Plan: IUP at 39+ weeks in active labor, A2GDM controlled with Metformin.  Admitted in active labor, see delivery note.   Leighton Roachodd D Ciearra Rufo 02/03/2018, 3:04 AM

## 2018-02-03 NOTE — MAU Note (Signed)
Patient presents with contractions back to back.  Does not report LOF.  Having some bleeding.  +FM.  Reports no complications with her pregnancy.  Speaks Arabic.

## 2018-02-03 NOTE — Progress Notes (Signed)
Post Partum Day 0 Subjective: no complaints, up ad lib, voiding, tolerating PO and nl lochia, pain controlled  Objective: Blood pressure 119/66, pulse 63, temperature 98 F (36.7 C), temperature source Oral, resp. rate 18.  Physical Exam:  General: alert and no distress Lochia: appropriate Uterine Fundus: firm  Recent Labs    02/03/18 0220  HGB 12.5  HCT 37.1    Assessment/Plan: Breastfeeding and Lactation consult.  Routine PP care   LOS: 0 days   Cheryl Harper 02/03/2018, 8:02 AM

## 2018-02-04 ENCOUNTER — Encounter (HOSPITAL_COMMUNITY): Payer: Self-pay

## 2018-02-04 NOTE — Progress Notes (Signed)
Patient ID: Cheryl Harper, female   DOB: 02/14/1981, 37 y.o.   MRN: [id number] Pt doing well. Pain controlled with ibuprofen. Lochia mild. Ambulating and voiding well. No complaints. Bonding well with baby. Breastfeeding.  VSS ABD - FF EXT - no edema  A/P: PPD#1 s/p svd with vaginal repair         Routine pp care         D/C home tomorrow

## 2018-02-05 MED ORDER — WITCH HAZEL-GLYCERIN EX PADS: 1.0000 "application " | MEDICATED_PAD | CUTANEOUS | 12 refills | Status: AC | PRN

## 2018-02-05 MED ORDER — DIBUCAINE 1 % RE OINT: 1.0000 "application " | TOPICAL_OINTMENT | RECTAL | 1 refills | Status: AC | PRN

## 2018-02-05 MED ORDER — ACETAMINOPHEN 325 MG PO TABS: 650.0000 mg | ORAL_TABLET | ORAL | 0 refills | Status: AC | PRN

## 2018-02-05 MED ORDER — BENZOCAINE-MENTHOL 20-0.5 % EX AERO: 1.0000 "application " | INHALATION_SPRAY | CUTANEOUS | 0 refills | Status: AC | PRN

## 2018-02-05 MED ORDER — IBUPROFEN 600 MG PO TABS: 600.0000 mg | ORAL_TABLET | Freq: Four times a day (QID) | ORAL | 0 refills | Status: AC

## 2018-02-05 NOTE — Progress Notes (Signed)
Post Partum Day 2 Subjective: up ad lib and tolerating PO  C/o hemorrhoids being uncomfortable  Objective: Blood pressure 100/61, pulse 91, temperature 98.1 F (36.7 C), temperature source Oral, resp. rate 18, SpO2 100 %, unknown if currently breastfeeding.  Physical Exam:  General: alert and cooperative Lochia: appropriate Uterine Fundus: firm   Recent Labs    02/03/18 0220  HGB 12.5  HCT 37.1    Assessment/Plan: Discharge home Ibuprofen and hemorrhoid cream   LOS: 2 days   Cheryl Harper 02/05/2018, 9:10 AM

## 2018-02-05 NOTE — Discharge Summary (Signed)
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 yo G5P4005 at 7756w3d was admitted in Active Labor on 02/03/2018. Patient had an uncomplicated labor course as follows:  Membrane Rupture Time/Date: 2:35 AM ,02/03/2018   Intrapartum Procedures: Episiotomy: None [1]                                         Lacerations:  Perineal [11];Labial [10] Revision of female circumcision Patient had a delivery of a Viable infant. 02/03/2018  Information for the patient's newborn:  Cathi Roanbdalla, Boy Eyleen [[id number]][[id number]]  Delivery Method: Vaginal, Spontaneous(Filed from Delivery Summary)    Pateint had an uncomplicated postpartum course.  She is ambulating, tolerating a regular diet, passing flatus, and urinating well. Patient is discharged home in stable condition on 02/05/18.   Physical exam  Vitals:   02/04/18 0617 02/04/18 1415 02/04/18 2202 02/05/18 0622  BP: 108/64 123/71 116/68 100/61  Pulse: 84 (!) 104 98 91  Resp: 17 18 16 18   Temp: 98.5 F (36.9 C) 98.4 F (36.9 C) 98.1 F (36.7 C)   TempSrc: Oral Oral Oral   SpO2: 97%  98% 100%   General: alert and cooperative Lochia: appropriate Uterine Fundus: firm  Labs: Lab Results  Component Value Date   WBC 9.0 02/03/2018   HGB 12.5 02/03/2018   HCT 37.1 02/03/2018   MCV 90.7 02/03/2018   PLT 215 02/03/2018    CMP Latest Ref Rng & Units 07/23/2010  Glucose 70 - 99 mg/dL 98  BUN 6 - 23 mg/dL 12  Creatinine 0.4 - 1.2 mg/dL 0.6  Sodium 629135 - 528145 mEq/L 139  Potassium 3.5 - 5.1 mEq/L 4.2  Chloride 96 - 112 mEq/L 106  CO2 19 - 32 mEq/L -  Calcium 8.4 - 10.5 mg/dL -  Total Protein 6.0 - 8.3 g/dL -  Total Bilirubin 0.3 - 1.2 mg/dL -  Alkaline Phos 39 - 413117 U/L -  AST 0 - 37 U/L -  ALT 0 - 35 U/L -    Discharge instruction: per After Visit Summary and "Baby and Me Booklet".  After visit meds:  Allergies as of 02/05/2018   No Known Allergies     Medication List    STOP taking these medications   metFORMIN 500 MG (MOD) 24 hr tablet Commonly known as:  GLUMETZA     TAKE these medications   acetaminophen 325 MG tablet Commonly known as:  TYLENOL Take 2 tablets (650 mg total) by mouth every 4 (four) hours as needed (for pain scale < 4).   benzocaine-Menthol 20-0.5 % Aero Commonly known as:  DERMOPLAST Apply 1 application topically as needed (perineal discomfort).   dibucaine 1 % Oint Commonly known as:  NUPERCAINAL Place 1 application rectally as needed for hemorrhoids.   ibuprofen 600 MG tablet Commonly known as:  ADVIL,MOTRIN Take 1 tablet (600 mg total) by mouth every 6 (six) hours.   prenatal multivitamin Tabs tablet Take 1 tablet by mouth daily at 12 noon.   witch hazel-glycerin pad Commonly known as:  TUCKS Apply 1 application topically as needed for hemorrhoids.       Diet: routine diet  Activity: Advance as tolerated. Pelvic rest for 6 weeks.   Outpatient follow up:6 weeks Follow up Appt:No future appointments. Follow up Visit:No follow-ups on file.  Postpartum contraception: None  Newborn Data: Live born female  Birth Weight: 7 lb 10.6 oz (3475 g) APGAR: 9, 9  Newborn Delivery   Birth date/time:  02/03/2018 02:36:00 Delivery type:  Vaginal, Spontaneous     Baby Feeding: Breast Disposition:home with mother   02/05/2018 Oliver Pila,  MD

## 2018-02-05 NOTE — Lactation Note (Signed)
This note was copied from a baby's chart. Lactation Consultation Note  Patient Name: Boy Weyman CroonMayada Saccente ZOXWR'UToday's Date: 02/05/2018 Reason for consult: Follow-up assessment;Term  P5 mother whose infant is now 1558 hours old.  Mother has breast feeding experience, however, her last child is now 36 years old.  Father is interpreting for mother and has signed the form for interpretation.  Mother had no questions/concerns related to breast feeding.  Encouraged her to feed 8-12 times/24 hours or sooner if baby shows cues.    Manual hand pump in room; reviewed with mother.  Engorgement prevention/treatment discussed.  Mother has our OP phone number for questions/concerns after discharge.  She does not have a DEBP for home use but does not feel a need to get one at this time.  Discharge nurse in room and family ready for discharge.   Maternal Data Formula Feeding for Exclusion: No Has patient been taught Hand Expression?: Yes Does the patient have breastfeeding experience prior to this delivery?: Yes  Feeding Feeding Type: Breast Fed  LATCH Score                   Interventions    Lactation Tools Discussed/Used WIC Program: No   Consult Status Consult Status: Complete Date: 02/05/18 Follow-up type: Call as needed    Kmarion Rawl R Taffie Eckmann 02/05/2018, 1:15 PM

## 2018-02-08 ENCOUNTER — Inpatient Hospital Stay (HOSPITAL_COMMUNITY): Admission: RE | Admit: 2018-02-08 | Payer: Managed Care, Other (non HMO) | Source: Ambulatory Visit

## 2019-06-24 ENCOUNTER — Ambulatory Visit: Payer: Managed Care, Other (non HMO) | Attending: Internal Medicine

## 2019-08-11 ENCOUNTER — Ambulatory Visit: Payer: Managed Care, Other (non HMO) | Attending: Internal Medicine

## 2019-08-11 DIAGNOSIS — Z23 Encounter for immunization: Secondary | ICD-10-CM

## 2019-08-11 NOTE — Progress Notes (Signed)
   Covid-19 Vaccination Clinic  Name:  Cheryl Harper    MRN: [id number] DOB: 02-22-1981  08/11/2019  [name] was observed post Covid-19 immunization for 15 minutes without incident. She was provided with Vaccine Information Sheet and instruction to access the V-Safe system.   [name] was instructed to call 911 with any severe reactions post vaccine: Marland Kitchen Difficulty breathing  . Swelling of face and throat  . A fast heartbeat  . A bad rash all over body  . Dizziness and weakness   Immunizations Administered    Name Date Dose VIS Date Route   Pfizer COVID-19 Vaccine 08/11/2019  4:39 PM 0.3 mL 05/18/2018 Intramuscular   Manufacturer: [employer], [employer]   Lot: [id number]   NDC: [address]-7

## 2019-09-05 ENCOUNTER — Ambulatory Visit: Payer: Managed Care, Other (non HMO) | Attending: Internal Medicine

## 2019-09-05 DIAGNOSIS — Z23 Encounter for immunization: Secondary | ICD-10-CM

## 2019-09-05 NOTE — Progress Notes (Signed)
   Covid-19 Vaccination Clinic  Name:  CHEYENE HAMRIC    MRN: [id number] DOB: 12/07/1980  09/05/2019  Ms. Sole was observed post Covid-19 immunization for 15 minutes without incident. She was provided with Vaccine Information Sheet and instruction to access the V-Safe system.   Ms. Elsbernd was instructed to call 911 with any severe reactions post vaccine: Marland Kitchen Difficulty breathing  . Swelling of face and throat  . A fast heartbeat  . A bad rash all over body  . Dizziness and weakness   Immunizations Administered    Name Date Dose VIS Date Route   Pfizer COVID-19 Vaccine 09/05/2019  4:28 PM 0.3 mL 05/18/2018 Intramuscular   Manufacturer: [employer], [employer]   Lot: [id number]   NDC: [address]-3

## 2019-10-25 ENCOUNTER — Encounter: Payer: Self-pay | Admitting: Gastroenterology

## 2019-12-07 ENCOUNTER — Ambulatory Visit: Payer: Managed Care, Other (non HMO) | Admitting: Gastroenterology

## 2020-01-26 ENCOUNTER — Ambulatory Visit: Payer: Managed Care, Other (non HMO) | Admitting: Gastroenterology

## 2020-10-24 ENCOUNTER — Other Ambulatory Visit: Payer: Self-pay | Admitting: Gastroenterology

## 2020-10-24 DIAGNOSIS — B181 Chronic viral hepatitis B without delta-agent: Secondary | ICD-10-CM

## 2020-11-01 ENCOUNTER — Ambulatory Visit
Admission: RE | Admit: 2020-11-01 | Discharge: 2020-11-01 | Disposition: A | Discharge status: Home / Self Care | Payer: Managed Care, Other (non HMO) | Source: Ambulatory Visit | Attending: Gastroenterology | Admitting: Gastroenterology

## 2020-11-01 DIAGNOSIS — B181 Chronic viral hepatitis B without delta-agent: Secondary | ICD-10-CM

## 2022-01-22 ENCOUNTER — Other Ambulatory Visit: Payer: Self-pay | Admitting: Gastroenterology

## 2022-01-22 DIAGNOSIS — B181 Chronic viral hepatitis B without delta-agent: Secondary | ICD-10-CM

## 2022-01-22 DIAGNOSIS — K760 Fatty (change of) liver, not elsewhere classified: Secondary | ICD-10-CM

## 2022-02-12 ENCOUNTER — Ambulatory Visit
Admission: RE | Admit: 2022-02-12 | Discharge: 2022-02-12 | Disposition: A | Discharge status: Home / Self Care | Payer: Medicaid Other | Source: Ambulatory Visit | Attending: Gastroenterology | Admitting: Gastroenterology

## 2022-02-12 DIAGNOSIS — B181 Chronic viral hepatitis B without delta-agent: Secondary | ICD-10-CM

## 2022-02-12 DIAGNOSIS — K760 Fatty (change of) liver, not elsewhere classified: Secondary | ICD-10-CM

## 2022-09-12 ENCOUNTER — Other Ambulatory Visit: Payer: Self-pay | Admitting: Family Medicine

## 2022-09-12 DIAGNOSIS — R1030 Lower abdominal pain, unspecified: Secondary | ICD-10-CM

## 2022-09-18 ENCOUNTER — Ambulatory Visit
Admission: RE | Admit: 2022-09-18 | Discharge: 2022-09-18 | Disposition: A | Discharge status: Home / Self Care | Payer: Medicaid Other | Source: Ambulatory Visit | Attending: Family Medicine | Admitting: Family Medicine

## 2022-09-18 DIAGNOSIS — R1030 Lower abdominal pain, unspecified: Secondary | ICD-10-CM

## 2023-01-12 ENCOUNTER — Other Ambulatory Visit: Payer: Self-pay | Admitting: Gastroenterology

## 2023-01-12 DIAGNOSIS — B181 Chronic viral hepatitis B without delta-agent: Secondary | ICD-10-CM

## 2023-07-24 ENCOUNTER — Other Ambulatory Visit: Payer: Self-pay | Admitting: Obstetrics and Gynecology

## 2023-07-24 DIAGNOSIS — R928 Other abnormal and inconclusive findings on diagnostic imaging of breast: Secondary | ICD-10-CM

## 2023-08-25 ENCOUNTER — Other Ambulatory Visit: Payer: Self-pay | Admitting: Obstetrics and Gynecology

## 2023-08-25 ENCOUNTER — Encounter

## 2023-08-25 ENCOUNTER — Ambulatory Visit
Admission: RE | Admit: 2023-08-25 | Discharge: 2023-08-25 | Disposition: A | Discharge status: Home / Self Care | Source: Ambulatory Visit | Attending: Obstetrics and Gynecology | Admitting: Obstetrics and Gynecology

## 2023-08-25 DIAGNOSIS — R928 Other abnormal and inconclusive findings on diagnostic imaging of breast: Secondary | ICD-10-CM

## 2023-08-25 DIAGNOSIS — R590 Localized enlarged lymph nodes: Secondary | ICD-10-CM

## 2023-09-08 ENCOUNTER — Ambulatory Visit
Admission: RE | Admit: 2023-09-08 | Discharge: 2023-09-08 | Disposition: A | Discharge status: Home / Self Care | Source: Ambulatory Visit | Attending: Obstetrics and Gynecology | Admitting: Obstetrics and Gynecology

## 2023-09-08 DIAGNOSIS — R590 Localized enlarged lymph nodes: Secondary | ICD-10-CM

## 2023-09-08 DIAGNOSIS — R928 Other abnormal and inconclusive findings on diagnostic imaging of breast: Secondary | ICD-10-CM

## 2023-09-09 LAB — SURGICAL PATHOLOGY

## 2023-12-24 ENCOUNTER — Other Ambulatory Visit: Payer: Self-pay | Admitting: Gastroenterology

## 2023-12-24 DIAGNOSIS — B181 Chronic viral hepatitis B without delta-agent: Secondary | ICD-10-CM

## 2023-12-24 DIAGNOSIS — D649 Anemia, unspecified: Secondary | ICD-10-CM

## 2024-01-07 ENCOUNTER — Ambulatory Visit
Admission: RE | Admit: 2024-01-07 | Discharge: 2024-01-07 | Disposition: A | Discharge status: Home / Self Care | Source: Ambulatory Visit | Attending: Gastroenterology | Admitting: Gastroenterology

## 2024-01-07 DIAGNOSIS — B181 Chronic viral hepatitis B without delta-agent: Secondary | ICD-10-CM

## 2024-01-07 DIAGNOSIS — D649 Anemia, unspecified: Secondary | ICD-10-CM

## 2024-01-07 MED ORDER — IOPAMIDOL (ISOVUE-370) INJECTION 76%
75.0000 mL | Freq: Once | INTRAVENOUS | Status: AC | PRN
  Administered 2024-01-07: 75 mL via INTRAVENOUS

## 2024-03-24 ENCOUNTER — Telehealth: Admitting: Physician Assistant

## 2024-03-24 DIAGNOSIS — R112 Nausea with vomiting, unspecified: Secondary | ICD-10-CM

## 2024-03-24 MED ORDER — ONDANSETRON 4 MG PO TBDP: 4.0000 mg | ORAL_TABLET | Freq: Three times a day (TID) | ORAL | 0 refills | Status: AC | PRN

## 2024-03-24 NOTE — Progress Notes (Signed)
 We are sorry that you are not feeling well. Here is how we plan to help!  Based on what you have shared with me it looks like you have a Virus that is irritating your GI tract.  Vomiting is the forceful emptying of a portion of the stomach's content through the mouth.  Although nausea and vomiting can make you feel miserable, it's important to remember that these are not diseases, but rather symptoms of an underlying illness.  When we treat short term symptoms, we always caution that any symptoms that persist should be fully evaluated in a medical office.  I have prescribed a medication that will help alleviate your symptoms and allow you to stay hydrated:  Zofran 4 mg 1 tablet every 8 hours as needed for nausea and vomiting  HOME CARE: Drink clear liquids.  This is very important! Dehydration (the lack of fluid) can lead to a serious complication.  Start off with 1 tablespoon every 5 minutes for 8 hours. You may begin eating bland foods after 8 hours without vomiting.  Start with saltine crackers, white bread, rice, mashed potatoes, applesauce. After 48 hours on a bland diet, you may resume a normal diet. Try to go to sleep.  Sleep often empties the stomach and relieves the need to vomit.  GET HELP RIGHT AWAY IF:  Your symptoms do not improve or worsen within 2 days after treatment. You have a fever for over 3 days. You cannot keep down fluids after trying the medication.  MAKE SURE YOU:  Understand these instructions. Will watch your condition. Will get help right away if you are not doing well or get worse.   Thank you for choosing an e-visit. Your e-visit answers were reviewed by a board certified advanced clinical practitioner to complete your personal care plan. Depending upon the condition, your plan could have included both over the counter or prescription medications. Please review your pharmacy choice. Be sure that the pharmacy you have chosen is open so that you can pick up  your prescription now.  If there is a problem you may message your provider in MyChart to have the prescription routed to another pharmacy. Your safety is important to us . If you have drug allergies check your prescription carefully.  For the next 24 hours, you can use MyChart to ask questions about today's visit, request a non-urgent call back, or ask for a work or school excuse from your e-visit provider. You will get an e-mail in the next two days asking about your experience. I hope that your e-visit has been valuable and will speed your recovery.  I have spent 5 minutes in review of e-visit questionnaire, review and updating patient chart, medical decision making and response to patient.   Elsie Velma Lunger, PA-C
